# Patient Record
Sex: Female | Born: 2005 | Race: Black or African American | Hispanic: No | Marital: Single | Smoking: Never smoker
Health system: Southern US, Community
[De-identification: ages and names within clinical notes are randomized; demographics above are authoritative.]

---
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
## 2024-11-13 ENCOUNTER — Other Ambulatory Visit: Payer: Self-pay

## 2024-11-13 ENCOUNTER — Encounter: Payer: Self-pay | Admitting: Medical Oncology

## 2024-11-13 ENCOUNTER — Emergency Department
Admission: EM | Admit: 2024-11-13 | Discharge: 2024-11-13 | Disposition: A | Discharge status: Other Acute Inpatient Hospital | Payer: Self-pay | Attending: Emergency Medicine | Admitting: Emergency Medicine

## 2024-11-13 ENCOUNTER — Inpatient Hospital Stay
Admission: AD | Admit: 2024-11-13 | Discharge: 2024-11-19 | DRG: 885 | Disposition: A | Discharge status: Home / Self Care | Payer: 59 | Source: Intra-hospital | Attending: Psychiatry | Admitting: Psychiatry

## 2024-11-13 DIAGNOSIS — R45851 Suicidal ideations: Secondary | ICD-10-CM

## 2024-11-13 DIAGNOSIS — Z23 Encounter for immunization: Secondary | ICD-10-CM

## 2024-11-13 DIAGNOSIS — Z818 Family history of other mental and behavioral disorders: Secondary | ICD-10-CM | POA: Diagnosis not present

## 2024-11-13 DIAGNOSIS — F419 Anxiety disorder, unspecified: Secondary | ICD-10-CM | POA: Diagnosis present

## 2024-11-13 DIAGNOSIS — F332 Major depressive disorder, recurrent severe without psychotic features: Principal | ICD-10-CM | POA: Diagnosis present

## 2024-11-13 DIAGNOSIS — F329 Major depressive disorder, single episode, unspecified: Secondary | ICD-10-CM | POA: Insufficient documentation

## 2024-11-13 DIAGNOSIS — W458XXA Other foreign body or object entering through skin, initial encounter: Secondary | ICD-10-CM | POA: Insufficient documentation

## 2024-11-13 LAB — ACETAMINOPHEN LEVEL: Acetaminophen (Tylenol), Serum: <10 ug/mL — ABNORMAL LOW (ref 10–30)

## 2024-11-13 LAB — URINE DRUG SCREEN
Amphetamines: NEGATIVE
Barbiturates: NEGATIVE
Benzodiazepines: NEGATIVE
Cocaine: NEGATIVE
Fentanyl: NEGATIVE
Methadone Scn, Ur: NEGATIVE
Opiates: NEGATIVE
Tetrahydrocannabinol: POSITIVE — AB

## 2024-11-13 LAB — ETHANOL: Alcohol, Ethyl (B): <15 mg/dL

## 2024-11-13 LAB — COMPREHENSIVE METABOLIC PANEL WITH GFR
ALT: 7 U/L (ref 0–44)
AST: 16 U/L (ref 15–41)
Albumin: 4.9 g/dL (ref 3.5–5.0)
Alkaline Phosphatase: 45 U/L (ref 38–126)
Anion gap: 12 (ref 5–15)
BUN: 12 mg/dL (ref 6–20)
CO2: 25 mmol/L (ref 22–32)
Calcium: 9.3 mg/dL (ref 8.9–10.3)
Chloride: 103 mmol/L (ref 98–111)
Creatinine, Ser: 0.84 mg/dL (ref 0.44–1.00)
GFR, Estimated: >60 mL/min
Glucose, Bld: 108 mg/dL — ABNORMAL HIGH (ref 70–99)
Potassium: 3.9 mmol/L (ref 3.5–5.1)
Sodium: 140 mmol/L (ref 135–145)
Total Bilirubin: 0.6 mg/dL (ref 0.0–1.2)
Total Protein: 7.5 g/dL (ref 6.5–8.1)

## 2024-11-13 LAB — CBC
HCT: 36.9 % (ref 36.0–46.0)
Hemoglobin: 12.7 g/dL (ref 12.0–15.0)
MCH: 31.9 pg (ref 26.0–34.0)
MCHC: 34.4 g/dL (ref 30.0–36.0)
MCV: 92.7 fL (ref 80.0–100.0)
Platelets: 271 K/uL (ref 150–400)
RBC: 3.98 MIL/uL (ref 3.87–5.11)
RDW: 12.2 % (ref 11.5–15.5)
WBC: 6.6 K/uL (ref 4.0–10.5)
nRBC: 0 % (ref 0.0–0.2)

## 2024-11-13 LAB — SALICYLATE LEVEL: Salicylate Lvl: <7 mg/dL — ABNORMAL LOW (ref 7.0–30.0)

## 2024-11-13 LAB — POC URINE PREG, ED: Preg Test, Ur: NEGATIVE

## 2024-11-13 MED ADMIN — Trazodone HCl Tab 50 MG: 50 mg | ORAL | NDC 00904686861

## 2024-11-13 MED ADMIN — Hydroxyzine HCl Tab 25 MG: 25 mg | ORAL | NDC 60687067511

## 2024-11-13 MED ADMIN — Lidocaine HCl Local Preservative Free (PF) Inj 1%: 5 mL | NDC 63323049209

## 2024-11-13 MED FILL — Lidocaine HCl Local Preservative Free (PF) Inj 1%: 5.0000 mL | INTRAMUSCULAR | Qty: 5 | Status: AC

## 2024-11-13 MED FILL — Trazodone HCl Tab 50 MG: 50.0000 mg | ORAL | Qty: 1 | Status: AC

## 2024-11-13 MED FILL — Acetaminophen Tab 325 MG: 650.0000 mg | ORAL | Qty: 2 | Status: AC

## 2024-11-13 MED FILL — Hydroxyzine HCl Tab 25 MG: 25.0000 mg | ORAL | Qty: 1 | Status: AC

## 2024-11-13 MED FILL — Lidocaine HCl Local Preservative Free (PF) Inj 1%: 30.0000 mL | INTRAMUSCULAR | Qty: 30 | Status: AC

## 2024-11-13 NOTE — ED Notes (Signed)
 Dinner tray provided to pt

## 2024-11-13 NOTE — Consult Note (Signed)
 [facility] Psychiatric Consult Initial  Patient Name: .Denise Hall  MRN: [id number]  DOB: 13-May-2006  Consult Order details:  Orders (From admission, onward)     Start     Ordered   11/13/24 1129  CONSULT TO CALL ACT TEAM       Ordering Provider: Jossie Artist POUR, MD  Provider:  (Not yet assigned)  Question:  Reason for Consult?  Answer:  Psych consult   11/13/24 1129   11/13/24 1129  IP CONSULT TO PSYCHIATRY       Ordering Provider: Jossie Artist POUR, MD  Provider:  (Not yet assigned)  Question Answer Comment  Consult Timeframe ROUTINE - requires response within 24 hours   Reason for Consult? Consult for medication management   Contact phone number where the requesting provider can be reached [phone]      11/13/24 1129             Mode of Visit: In person    Psychiatry Consult Evaluation  Service Date: November 13, 2024 LOS:  LOS: 0 days  Chief Complaint I got into an argument with my mom  Primary Psychiatric Diagnoses   Suicidal ideation   Assessment   Denise Hall is a 18 y.o. female admitted: Presented to the EDfor 11/13/2024 10:29 AM for threatening to kill herself today with a knife . She carries the psychiatric diagnoses of MDD and has a past medical history of  none stated.   Due to patient's presentation, including threats of self-harm with a knife requiring police intervention and taser deployment, recent undisclosed pill ingestion per collateral report, pattern of frequent suicidal statements per mother, lack of engagement with outpatient mental health services, poor insight with minimization of concerning behaviors, and current lack of safety plan or support system, patient poses significant risk to self. Recommend upholding involuntary commitment. Patient is recommended for inpatient psychiatric admission.    Diagnoses:  Active Hospital problems: Active Problems:   Suicidal ideation    Plan   ## Psychiatric Medication Recommendations:  Defer to  inpatient psychiatric unit  ## Medical Decision Making Capacity: Not specifically addressed in this encounter  ## Further Work-up:   -- EKG obtained today and showed QTc of 400 -- Pertinent labwork reviewed earlier this admission includes: CBC, CMP, ethanol, acetaminophen  and salicylate level pending   ## Disposition:--Patient is recommended for inpatient psychiatric admission  ## Behavioral / Environmental: - No specific recommendations at this time.     ## Safety and Observation Level:  - Based on my clinical evaluation, I estimate the patient to be at low risk of self harm in the current setting. - At this time, we recommend  routine. This decision is based on my review of the chart including patient's history and current presentation, interview of the patient, mental status examination, and consideration of suicide risk including evaluating suicidal ideation, plan, intent, suicidal or self-harm behaviors, risk factors, and protective factors. This judgment is based on our ability to directly address suicide risk, implement suicide prevention strategies, and develop a safety plan while the patient is in the clinical setting. Please contact our team if there is a concern that risk level has changed.  CSSR Risk Category:C-SSRS RISK CATEGORY: No Risk  Suicide Risk Assessment: Patient has following modifiable risk factors for suicide: lack of access to outpatient mental health resources, current symptoms: anxiety/panic, insomnia, impulsivity, anhedonia, hopelessness, and triggering events, which we are addressing by recommending inpatient psychiatric admission. Patient has following non-modifiable or demographic risk factors for  suicide: history of suicide attempt and psychiatric hospitalization Patient has the following protective factors against suicide: Supportive family  Thank you for this consult request. Recommendations have been communicated to the primary team.  We will sign off at  this time.   [name], NP        History of Present Illness  Relevant Aspects of Hospital ED   Patient Report:  Patient presented under involuntary commitment via law enforcement. Per IVC papers, law enforcement was called to the residence where patient reportedly picked up a knife, held it to her stomach, and threatened to kill herself. Of note, police reported patient was tased due to not following commands and attempting to harm herself.  During assessment with the psychiatry team, patient reported she and her mother got into an argument that started yesterday and continued into today. Patient stated her mother called the police during this altercation. Patient reported she pulled out the knife because her mother threatened to send her back to the mental hospital. Patient acknowledged stating she was going to kill herself but reported she did not actually mean this. She stated she feels like she has to make statements such as this so that her family will listen to her.  Patient currently denies suicidal or homicidal ideation. She denied auditory or visual hallucinations. Patient reported previously being prescribed psychotropic medications but stopped taking them shortly after her most recent psychiatric admission in February 2024 due to nightmares and medication causing her to wet the bed. Patient reported past self-harm with most recent attempt being 2 years ago when she intentionally overdosed on pills. Patient denied current depression but endorsed feeling overwhelmed. She reported that school, working full-time, and her relationship with her mother are overwhelming to her. 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 She denied alcohol and tobacco use but reported occasional THC use. Patient denied access to firearms or pending legal charges.  Patient lives at home with her mother and mother's wife. Patient denied  being established with any outpatient mental health providers or having access to outpatient mental health care.  Collateral information was obtained from patient's mother with patient's permission. Mother reported she was planning to take out IVC papers today after patient told her yesterday that she took 5 pills yesterday and was going to let her know if it worked. Mother is unsure what pills patient took as patient did not disclose this information. Mother reported patient soon after texted her stating it did not work. Mother reported patient often threatens to kill herself, stating this occurs at least once a week. Mother reports patient says she is alone and a lonely person, which contributes to these statements. Mother expressed frustration that patient says she does not listen to her, yet patient does not talk or disclose feelings to her. Mother confirmed patient is not aligned with any mental health services in the community. Mother reported she feels patient needs help and noted that she and patient's brother also suffer with depression. She reported attempting to normalize asking for help by explaining that family members struggle with mental health as well and tried to explain that mental health is a disease, to which patient reportedly responded you think I'm a disease?  On mental status examination, patient was alert and oriented to person, place, time, and situation. There was no evidence of mania or psychosis, and patient did not appear to be responding to internal stimuli. Patient demonstrated poor  insight, minimizing the events that occurred and stating I cannot be here for 5 days.  Psych ROS:  Depression: Denied Anxiety: Endorsed Mania (lifetime and current): Denied Psychosis: (lifetime and current): Denied  Collateral information:  Contacted mother, Jon at number provided by patient on 11/13/2024    Psychiatric and Social History  Psychiatric History:  Information  collected from patient/chart review  Prev Dx/Sx: MDD Current Psych Provider: None Home Meds (current): None Previous Med Trials: Lexapro Therapy: Denied  Prior Psych Hospitalization: Yes Prior Self Harm: Yes Prior Violence: Patient denied  Family Psych History: Depression on maternal side Family Hx suicide: Denied  Social History:   Educational Hx: In college 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 Hx: Denied Living Situation: With mother Access to weapons/lethal means: Denied access to firearms  Substance History Alcohol: Denied Tobacco: Denied Illicit drugs: Reported occasional THC use Prescription drug abuse: Denied Rehab hx: Denied  Exam Findings  Physical Exam: Reviewed and agree with the physical exam findings conducted by the medical provider Vital Signs:  Temp:  [97.5 F (36.4 C)] 97.5 F (36.4 C) (12/25 1042) Pulse Rate:  [84] 84 (12/25 1042) Resp:  [16] 16 (12/25 1042) BP: (121)/(69) 121/69 (12/25 1042) SpO2:  [100 %] 100 % (12/25 1042) Weight:  [59 kg] 59 kg (12/25 1043) Blood pressure 121/69, pulse 84, temperature (!) 97.5 F (36.4 C), temperature source Oral, resp. rate 16, height 5' 2 (1.575 m), weight 59 kg, SpO2 100%. Body mass index is 23.78 kg/m.    Mental Status Exam: General Appearance: Casual  Orientation:  Full (Time, Place, and Person)  Memory:  Fair  Concentration:  Concentration: Fair  Recall:  Fair  Attention  Fair  Eye Contact:  Fair  Speech:  Clear and Coherent  Language:  Good  Volume:  Decreased  Mood: Depressed  Affect:  Tearful  Thought Process:  Coherent, Goal Directed, and Linear  Thought Content:  Logical  Suicidal Thoughts:  No  Homicidal Thoughts:  No  Judgement:  Impaired  Insight:  Lacking  Psychomotor Activity:  Normal  Akathisia:  No  Fund of Knowledge:  Good      Assets:  Communication Skills Housing Physical Health  Cognition:  WNL  ADL's:  Intact  AIMS (if indicated):        Other History    These have been pulled in through the EMR, reviewed, and updated if appropriate.  Family History:  The patient's family history is not on file.  Medical History: History reviewed. No pertinent past medical history.  Surgical History: History reviewed. No pertinent surgical history.   Medications:  Current Medications[1]  Allergies: Allergies[2]  [name], NP This note was created using Dragon dictation software. Please excuse any inadvertent transcription errors. Case was discussed with supervising physician Dr. Jadapalle who is agreeable with current plan.       [1]  Current Facility-Administered Medications:    lidocaine  (PF) (XYLOCAINE ) 1 % injection 5 mL, 5 mL, Other, Once, Bradler, Evan K, MD No current outpatient medications on file. [2] No Known Allergies

## 2024-11-13 NOTE — ED Notes (Signed)
 Pt reports she told her mom that she wants to kill herself because that is the only way she felt she could get her attention. Denies SI/HI/AVH on assessment. Tearful during assessment but agreed to labs.

## 2024-11-13 NOTE — ED Notes (Signed)
 Pt's belongings 1 pair crocs 1 pair of black socks I black sweat pant 1 brown bra top I brown jacket 1 pink bunnet 1 brown scrunches. 1 gold anklet I neck chain 1 nose ring 1 pair of studs all placed in a hospital urine cup.

## 2024-11-13 NOTE — ED Notes (Signed)
 ivc accepted to [facility] on 11/13/24.

## 2024-11-13 NOTE — BH Assessment (Signed)
 Comprehensive Clinical Assessment (CCA) Note  11/13/2024 Denise Hall [id number]  Chief Complaint:  Chief Complaint  Patient presents with   IVC   Visit Diagnosis: Major Depressive Disorder   Denise Hall is 18 year old female who presents to the ER via law enforcement, under IVC. Per the IVC, Officer's respondent to her residents and in front of them she picked up a kitchen knife and threatened to kill herself and put the knife to her stomach. She was tased before she could stab herself.   Per the patient, she and her mother had an argument yesterday (11/12/2024), and she said she wanted to end her life. She further reports, the only way her mother listens to her if I say that. She admits to grabbing the knife today and saying she was going to end her life. Per her report, she grabbed the knife because her mother threatened to send her to a mental hospital. She shared her current stressors are her work, school and relationship with her mother.    Per the report of the patient's mother, the patient communicated with her yesterday (11/12/2024), that she ingested five pills in attempt to end her life. When the mother asked what she took she said she didn't know and proceeded to say, if I wake up I'll call you. The mother states, the patient contacted her within thirty minutes and said she was still alive. Today (11/13/2024), the argument continued and when law enforcement arrived to the house, they were going to stay with the patient, while the mother go to the magistrate and petition for her to be under IVC. However, she grabbed the knife and that's when patent examiner filed for the petition. The mother further reports, the patient threatens to end her life, once a week. She tried to get her in outpatient treatment to address the depression but the patient having a difficulty accepting she has depression and needs help. Previous medications caused her to wet the bed and she stop taking them.  The patient's mother and siblings both are prescribed medications to address their depression. When the mother tries to explain to the patient that depression is a sickness, the patient internalizes it and believe the mother is saying she is disease.  During the interview, the patient was calm, cooperative and pleasant. She became tearful when the psych team explained why she was being recommended for inpatient treatment. She admits to the use of cannabis, and denies the use of any other mind-altering substance. She denies involvement with the legal system.  CCA Screening, Triage and Referral (STR)  Patient Reported Information How did you hear about us ? Family/Friend  What Is the Reason for Your Visit/Call Today? Brought into ER under IVC due to voicing SI.  How Long Has This Been Causing You Problems? 1 wk - 1 month  What Do You Feel Would Help You the Most Today? Treatment for Depression or other mood problem   Have You Recently Had Any Thoughts About Hurting Yourself? No  Are You Planning to Commit Suicide/Harm Yourself At This time? No   Flowsheet Row ED from 11/13/2024 in [facility] Emergency Department at [facility]  C-SSRS RISK CATEGORY No Risk    Have you Recently Had Thoughts About Hurting Someone Denise Hall? No  Are You Planning to Harm Someone at This Time? No  Explanation: No data recorded  Have You Used Any Alcohol or Drugs in the Past 24 Hours? Yes  How Long Ago Did You Use Drugs or Alcohol? No data  recorded What Did You Use and How Much? THC   Do You Currently Have a Therapist/Psychiatrist? No  Name of Therapist/Psychiatrist:    Have You Been Recently Discharged From Any Office Practice or Programs? No  Explanation of Discharge From Practice/Program: No data recorded    CCA Screening Triage Referral Assessment Type of Contact: Face-to-Face  Telemedicine Service Delivery:   Is this Initial or Reassessment?   Date Telepsych consult ordered in [facility]:     Time Telepsych consult ordered in [facility]:    Location of Assessment: [facility] ED  Provider Location: [facility] ED   Collateral Involvement: No data recorded  Does Patient Have a Court Appointed Legal Guardian? No  Legal Guardian Contact Information: No data recorded Copy of Legal Guardianship Form: No data recorded Legal Guardian Notified of Arrival: No data recorded Legal Guardian Notified of Pending Discharge: No data recorded If Minor and Not Living with Parent(s), Who has Custody? No data recorded Is CPS involved or ever been involved? Never  Is APS involved or ever been involved? Never   Patient Determined To Be At Risk for Harm To Self or Others Based on Review of Patient Reported Information or Presenting Complaint? No  Method: No data recorded Availability of Means: No data recorded Intent: No data recorded Notification Required: No data recorded Additional Information for Danger to Others Potential: No data recorded Additional Comments for Danger to Others Potential: No data recorded Are There Guns or Other Weapons in Your Home? No  Types of Guns/Weapons: No data recorded Are These Weapons Safely Secured?                            No data recorded Who Could Verify You Are Able To Have These Secured: No data recorded Do You Have any Outstanding Charges, Pending Court Dates, Parole/Probation? No data recorded Contacted To Inform of Risk of Harm To Self or Others: No data recorded   Does Patient Present under Involuntary Commitment? Yes    [place] of Residence: [place]   Patient Currently Receiving the Following Services: Not Receiving Services   Determination of Need: Emergent (2 hours)   Options For Referral: Inpatient Hospitalization; ED Visit     CCA Biopsychosocial Patient Reported Schizophrenia/Schizoaffective Diagnosis in Past: No   Strengths: In school, working and stable housing.   Mental Health Symptoms Depression:  Change in energy/activity    Duration of Depressive symptoms: Duration of Depressive Symptoms: Greater than two weeks   Mania:  None   Anxiety:   N/A   Psychosis:  None   Duration of Psychotic symptoms:    Trauma:  N/A   Obsessions:  N/A   Compulsions:  N/A   Inattention:  N/A   Hyperactivity/Impulsivity:  N/A   Oppositional/Defiant Behaviors:  N/A   Emotional Irregularity:  N/A   Other Mood/Personality Symptoms:  No data recorded   Mental Status Exam Appearance and self-care  Stature:  Average   Weight:  Average weight   Clothing:  Neat/clean   Grooming:  Normal   Cosmetic use:  None   Posture/gait:  Normal   Motor activity:  -- (Within normal range)   Sensorium  Attention:  Normal   Concentration:  Normal   Orientation:  X5   Recall/memory:  Normal   Affect and Mood  Affect:  Depressed; Appropriate   Mood:  Depressed   Relating  Eye contact:  Normal   Facial expression:  Depressed; Responsive   Attitude  toward examiner:  Cooperative   Thought and Language  Speech flow: Clear and Coherent   Thought content:  Appropriate to Mood and Circumstances   Preoccupation:  None   Hallucinations:  None   Organization:  Intact; Linear   Company Secretary of Knowledge:  Fair   Intelligence:  Average   Abstraction:  Normal   Judgement:  Normal   Brewing Technologist   Insight:  Poor   Decision Making:  Impulsive   Social Functioning  Social Maturity:  Isolates   Social Judgement:  Chief Of Staff   Stress  Stressors:  Family conflict; Transitions; Work   Coping Ability:  Overwhelmed; Exhausted   Skill Deficits:  None   Supports:  Friends/Service system     Religion: Religion/Spirituality Are You A Religious Person?: No  Leisure/Recreation: Leisure / Recreation Do You Have Hobbies?: No  Exercise/Diet: Exercise/Diet Do You Exercise?: No Have You Gained or Lost A Significant Amount of Weight in the Past Six Months?: No Do You  Follow a Special Diet?: No Do You Have Any Trouble Sleeping?: No   CCA Employment/Education Employment/Work Situation: Employment / Work Situation Employment Situation: Surveyor, Minerals Job has Been Impacted by Current Illness: No Has Patient ever Been in the [employer]?: No  Education: Education Is Patient Currently Attending School?: No Did Theme Park Manager?: Yes   CCA Family/Childhood History Family and Relationship History: Family history Marital status: Single Does patient have children?: No  Childhood History:  Childhood History By whom was/is the patient raised?: Mother Did patient suffer any verbal/emotional/physical/sexual abuse as a child?: No Did patient suffer from severe childhood neglect?: No Has patient ever been sexually abused/assaulted/raped as an adolescent or adult?: No Was the patient ever a victim of a crime or a disaster?: No Witnessed domestic violence?: No Has patient been affected by domestic violence as an adult?: No   CCA Substance Use Alcohol/Drug Use: Alcohol / Drug Use Pain Medications: See MAR Prescriptions: See MAR Over the Counter: See MAR History of alcohol / drug use?: Yes Substance #1 Name of Substance 1: Cannabis 1 - Frequency: Unable to quantify 1 - Last Use / Amount: Unable to quantify    ASAM's:  Six Dimensions of Multidimensional Assessment  Dimension 1:  Acute Intoxication and/or Withdrawal Potential:      Dimension 2:  Biomedical Conditions and Complications:      Dimension 3:  Emotional, Behavioral, or Cognitive Conditions and Complications:     Dimension 4:  Readiness to Change:     Dimension 5:  Relapse, Continued use, or Continued Problem Potential:     Dimension 6:  Recovery/Living Environment:     ASAM Severity Score:    ASAM Recommended Level of Treatment:     Substance use Disorder (SUD)    Recommendations for Services/Supports/Treatments:    Disposition Recommendation per psychiatric provider:  Inpatient Treatment   DSM5 Diagnoses: Patient Active Problem List   Diagnosis Date Noted   Suicidal ideation 11/13/2024    Referrals to Alternative Service(s): Referred to Alternative Service(s):   Place:   Date:   Time:    Referred to Alternative Service(s):   Place:   Date:   Time:    Referred to Alternative Service(s):   Place:   Date:   Time:    Referred to Alternative Service(s):   Place:   Date:   Time:     Kiki DOROTHA Barge MS, LCAS, [facility], [facility] Therapeutic Triage Specialist 11/13/2024 2:25 PM
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
## 2024-11-13 NOTE — ED Notes (Signed)
 Provider in with pt to remove the taser prong that had been lodged in her breast tissue. Slight bleeding and swelling noted, area cleaned and Band-Aid applied. Pt tolerated it well

## 2024-11-13 NOTE — ED Notes (Signed)
 Lunch tray provided to pt.

## 2024-11-13 NOTE — ED Notes (Signed)
 Patient is IVC pending consult

## 2024-11-13 NOTE — ED Notes (Signed)
 Patient is IVC pending admit to [facility]
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
## 2024-11-14 DIAGNOSIS — R45851 Suicidal ideations: Secondary | ICD-10-CM | POA: Diagnosis not present

## 2024-11-14 MED ADMIN — Trazodone HCl Tab 50 MG: 50 mg | ORAL | NDC 00904686861

## 2024-11-14 MED ADMIN — Oxcarbazepine Tab 150 MG: 150 mg | ORAL | NDC 00904726261

## 2024-11-14 MED FILL — Oxcarbazepine Tab 150 MG: 150.0000 mg | ORAL | Qty: 1 | Status: AC

## 2024-11-14 MED FILL — Pantoprazole Sodium EC Tab 40 MG (Base Equiv): 40.0000 mg | ORAL | Qty: 1 | Status: AC

## 2024-11-14 MED FILL — Influenza Virus Vaccine Split PF Susp Pref Syringe 0.5 ML: 0.5000 mL | INTRAMUSCULAR | Qty: 0.5 | Status: AC

## 2024-11-14 NOTE — Progress Notes (Signed)
" °   11/13/24 2300  Psych Admission Type (Psych Patients Only)  Admission Status Involuntary  Psychosocial Assessment  Patient Complaints Anxiety;Depression  Eye Contact Brief  Facial Expression Flat  Affect Anxious;Appropriate to circumstance  Speech Logical/coherent  Interaction Assertive  Motor Activity Other (Comment) (wdl)  Appearance/Hygiene Unremarkable  Behavior Characteristics Cooperative;Appropriate to situation  Mood Pleasant  Thought Process  Coherency WDL  Content WDL  Delusions None reported or observed  Perception WDL  Hallucination None reported or observed  Judgment WDL  Confusion WDL  Danger to Self  Current suicidal ideation? Denies  Danger to Others  Danger to Others None reported or observed    "
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
## 2024-11-14 NOTE — Progress Notes (Signed)
" °   11/14/24 1802  Spiritual Encounters  Type of Visit Initial  Care provided to: Patient  Referral source Patient request  Reason for visit Routine spiritual support  OnCall Visit Yes  Interventions  Spiritual Care Interventions Made Established relationship of care and support;Compassionate presence;Reflective listening;Reconciliation with self/others;Encouragement;Meaning making   Chaplain met with patient per patient request.  Provide compassionate presence, reflective and supportive listening.  Helped her process the difficult communications with her mother, and discussed the hopes of reconciling the differences and conflicts they are having.  Hopeful that visit with mother will go well.  Offered continued support and conversation.   "

## 2024-11-14 NOTE — Group Note (Signed)
 Date:  11/14/2024 Time:  11:59 AM  Group Topic/Focus:  Emotional Education:   The focus of this group is to discuss what feelings/emotions are, and how they are experienced.    Participation Level:  Active  Participation Quality:  Appropriate  Affect:  Appropriate  Cognitive:  Appropriate  Insight: Appropriate  Engagement in Group:  Engaged  Modes of Intervention:  Activity and Discussion  Additional Comments:    Denise Hall June 11/14/2024, 11:59 AM

## 2024-11-14 NOTE — Progress Notes (Signed)
 Pt calm and pleasant during assessment denying SI/HI/AVH. Pt observed interacting appropriately with staff and peers on the unit. Pt compliant with medication administration per MD orders. Pt given education, support, and encouragement to be active in her treatment plan. Pt being monitored Q 15 minutes for safety per unit protocol, remains safe on the unit

## 2024-11-14 NOTE — Group Note (Signed)
 Date:  11/14/2024 Time:  8:52 PM  Group Topic/Focus:  Overcoming Stress:   The focus of this group is to define stress and help patients assess their triggers.    Participation Level:  Active  Participation Quality:  Appropriate  Affect:  Appropriate  Cognitive:  Alert  Insight: Appropriate  Engagement in Group:  Engaged  Modes of Intervention:  Discussion and Education  Additional Comments:    Ginny JONETTA Galeazzi 11/14/2024, 8:52 PM

## 2024-11-14 NOTE — BH IP Treatment Plan (Signed)
 Interdisciplinary Treatment and Diagnostic Plan Update  11/14/2024 Time of Session: 10:10AM Denise Hall MRN: [id number]  Principal Diagnosis: Suicidal ideations  Secondary Diagnoses: Principal Problem:   Suicidal ideations   Current Medications:  Current Facility-Administered Medications  Medication Dose Route Frequency Provider Last Rate Last Admin   acetaminophen  (TYLENOL ) tablet 650 mg  650 mg Oral Q6H PRN Smith, Annie B, NP       alum & mag hydroxide-simeth (MAALOX/MYLANTA) 200-200-20 MG/5ML suspension 30 mL  30 mL Oral Q4H PRN Smith, Annie B, NP       haloperidol  (HALDOL ) tablet 5 mg  5 mg Oral TID PRN Smith, Annie B, NP       And   diphenhydrAMINE  (BENADRYL ) capsule 50 mg  50 mg Oral TID PRN Smith, Annie B, NP       haloperidol  lactate (HALDOL ) injection 5 mg  5 mg Intramuscular TID PRN Smith, Annie B, NP       And   diphenhydrAMINE  (BENADRYL ) injection 50 mg  50 mg Intramuscular TID PRN Smith, Annie B, NP       And   LORazepam  (ATIVAN ) injection 2 mg  2 mg Intramuscular TID PRN Smith, Annie B, NP       haloperidol  lactate (HALDOL ) injection 10 mg  10 mg Intramuscular TID PRN Smith, Annie B, NP       And   diphenhydrAMINE  (BENADRYL ) injection 50 mg  50 mg Intramuscular TID PRN Smith, Annie B, NP       And   LORazepam  (ATIVAN ) injection 2 mg  2 mg Intramuscular TID PRN Smith, Annie B, NP       hydrOXYzine  (ATARAX ) tablet 25 mg  25 mg Oral TID PRN Smith, Annie B, NP   25 mg at 11/13/24 2234   magnesium  hydroxide (MILK OF MAGNESIA) suspension 30 mL  30 mL Oral Daily PRN Smith, Annie B, NP       traZODone  (DESYREL ) tablet 50 mg  50 mg Oral QHS PRN Smith, Annie B, NP   50 mg at 11/13/24 2234   PTA Medications: No medications prior to admission.    Patient Stressors:    Patient Strengths:    Treatment Modalities: Medication Management, Group therapy, Case management,  1 to 1 session with clinician, Psychoeducation, Recreational therapy.   Physician Treatment Plan for  Primary Diagnosis: Suicidal ideations Long Term Goal(s):     Short Term Goals:    Medication Management: Evaluate patient's response, side effects, and tolerance of medication regimen.  Therapeutic Interventions: 1 to 1 sessions, Unit Group sessions and Medication administration.  Evaluation of Outcomes: Not Met  Physician Treatment Plan for Secondary Diagnosis: Principal Problem:   Suicidal ideations  Long Term Goal(s):     Short Term Goals:       Medication Management: Evaluate patient's response, side effects, and tolerance of medication regimen.  Therapeutic Interventions: 1 to 1 sessions, Unit Group sessions and Medication administration.  Evaluation of Outcomes: Not Met   RN Treatment Plan for Primary Diagnosis: Suicidal ideations Long Term Goal(s): Knowledge of disease and therapeutic regimen to maintain health will improve  Short Term Goals: Ability to demonstrate self-control, Ability to participate in decision making will improve, Ability to verbalize feelings will improve, Ability to disclose and discuss suicidal ideas, Ability to identify and develop effective coping behaviors will improve, and Compliance with prescribed medications will improve  Medication Management: RN will administer medications as ordered by provider, will assess and evaluate patient's response and provide education to patient  for prescribed medication. RN will report any adverse and/or side effects to prescribing provider.  Therapeutic Interventions: 1 on 1 counseling sessions, Psychoeducation, Medication administration, Evaluate responses to treatment, Monitor vital signs and CBGs as ordered, Perform/monitor CIWA, COWS, AIMS and Fall Risk screenings as ordered, Perform wound care treatments as ordered.  Evaluation of Outcomes: Not Met   LCSW Treatment Plan for Primary Diagnosis: Suicidal ideations Long Term Goal(s): Safe transition to appropriate next level of care at discharge, Engage patient  in therapeutic group addressing interpersonal concerns.  Short Term Goals: Engage patient in aftercare planning with referrals and resources, Increase social support, Increase ability to appropriately verbalize feelings, Increase emotional regulation, Facilitate acceptance of mental health diagnosis and concerns, and Increase skills for wellness and recovery  Therapeutic Interventions: Assess for all discharge needs, 1 to 1 time with Social worker, Explore available resources and support systems, Assess for adequacy in community support network, Educate family and significant other(s) on suicide prevention, Complete Psychosocial Assessment, Interpersonal group therapy.  Evaluation of Outcomes: Not Met   Progress in Treatment: Attending groups: Yes. Participating in groups: Yes. Taking medication as prescribed: Yes. Toleration medication: Yes. Family/Significant other contact made: No, will contact:  once permission is granted Patient understands diagnosis: Yes. Discussing patient identified problems/goals with staff: Yes. Medical problems stabilized or resolved: Yes. Denies suicidal/homicidal ideation: Yes. Issues/concerns per patient self-inventory: No. Other: none  New problem(s) identified: No, Describe:  none  New Short Term/Long Term Goal(s): detox, elimination of symptoms of psychosis, medication management for mood stabilization; elimination of SI thoughts; development of comprehensive mental wellness/sobriety plan.   Patient Goals:  work on my anxiety and cope with things in life  Discharge Plan or Barriers: CSW to assist patient in development of appropriate discharge plans.   Reason for Continuation of Hospitalization: Anxiety Depression Medication stabilization Suicidal ideation  Estimated Length of Stay:  1-7 days  Last 3 [place] Suicide Severity Risk Score: Flowsheet Row Admission (Current) from 11/13/2024 in [facility] Most recent  reading at 11/13/2024 11:00 PM ED from 11/13/2024 in [facility] Emergency Department at [facility] Most recent reading at 11/13/2024 12:14 PM  C-SSRS RISK CATEGORY No Risk No Risk    Last PHQ 2/9 Scores:     No data to display          Scribe for Treatment Team: Sherryle JINNY Margo, LCSW 11/14/2024 10:35 AM

## 2024-11-14 NOTE — Progress Notes (Signed)
" °   11/14/24 1500  Psych Admission Type (Psych Patients Only)  Admission Status Involuntary  Psychosocial Assessment  Patient Complaints Anxiety;Depression  Eye Contact Brief  Facial Expression Flat  Affect Anxious  Speech Logical/coherent  Interaction Assertive  Motor Activity Slow  Appearance/Hygiene Unremarkable  Behavior Characteristics Cooperative  Mood Pleasant  Thought Process  Coherency WDL  Content WDL  Delusions None reported or observed  Perception WDL  Hallucination None reported or observed  Judgment WDL  Confusion WDL  Danger to Self  Current suicidal ideation? Denies  Danger to Others  Danger to Others None reported or observed    "
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
## 2024-11-14 NOTE — Plan of Care (Signed)
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
## 2024-11-14 NOTE — Group Note (Deleted)
 Date:  11/14/2024 Time:  8:47 PM  Group Topic/Focus:  Overcoming Stress:   The focus of this group is to define stress and help patients assess their triggers.     Participation Level:  {BHH PARTICIPATION OZCZO:77735}  Participation Quality:  {BHH PARTICIPATION QUALITY:22265}  Affect:  {BHH AFFECT:22266}  Cognitive:  {BHH COGNITIVE:22267}  Insight: {BHH Insight2:20797}  Engagement in Group:  {BHH ENGAGEMENT IN HMNLE:77731}  Modes of Intervention:  {BHH MODES OF INTERVENTION:22269}  Additional Comments:  PIERRETTE Ginny JONETTA Orma 11/14/2024, 8:47 PM

## 2024-11-14 NOTE — Plan of Care (Signed)
  Problem: Education: Goal: Emotional status will improve Outcome: Progressing   Problem: Education: Goal: Mental status will improve Outcome: Progressing   Problem: Education: Goal: Verbalization of understanding the information provided will improve Outcome: Progressing   Problem: Activity: Goal: Interest or engagement in activities will improve Outcome: Progressing   Problem: Coping: Goal: Ability to verbalize frustrations and anger appropriately will improve Outcome: Progressing

## 2024-11-14 NOTE — Group Note (Signed)
 Date:  11/14/2024 Time:  5:52 PM  Group Topic/Focus:  Activity Group: The focus of the group is to promote activity for the patients and to encourage them to go outside to the courtyard and get some fresh air and some exercise.    Participation Level:  Did Not Attend   Denise Hall 11/14/2024, 5:52 PM

## 2024-11-15 DIAGNOSIS — R45851 Suicidal ideations: Secondary | ICD-10-CM | POA: Diagnosis not present

## 2024-11-15 MED ADMIN — Trazodone HCl Tab 50 MG: 50 mg | ORAL | NDC 00904686861

## 2024-11-15 MED ADMIN — Oxcarbazepine Tab 150 MG: 150 mg | ORAL | NDC 00904726261

## 2024-11-15 MED ADMIN — Pantoprazole Sodium EC Tab 40 MG (Base Equiv): 40 mg | ORAL | NDC 51079005101

## 2024-11-15 NOTE — Group Note (Signed)
 LCSW Group Therapy Note   Group Date: 11/15/2024 Start Time: 1620 End Time: 1650   Type of Therapy and Topic:  Group Therapy: Managing Intrusive thoughts  Participation Level:  Active  Description of Group: The purpose of this group is to assist patients in learning how to regulate negative intrusive thoughts.  Intrusive thoughts are unwanted thoughts or vivid images that suddenly enter your mind.  They may be disturbing to the person experiencing them and may trigger feelings like anxiety, shame, or disgust.  Emphasis will be placed on coping with negative intrusive thoughts in situations and using positive strategies to combat negative intrusive thoughts and using positive strategies combat negative thoughts.   Therapeutic Goals: 1. Patient will identify the difference of an inconsequential thought vs an urgent thought. 2.Patient will discuss when they need to seek assistance with negative intrusive thoughts.           Summary of Patient Progress:   The facilitator and patient discussed negative intrusive thoughts and their impact on daily life.  Group members chose a negative intrusive thought and strategies for managing it .  The facilitator and patient examined how different experiences can influence their thoughts.  The patient reflected on how their thoughts, negative or positive can affect outcomes.  The patient was receptive to feedback from both peers and the facilitator and contributed to creating a supportive environment, encouraging others to open up and share.  Therapeutic Modalities:  Cognitive Behavioral Therapy Dialectical Behavior Therapy  [place] Orbin Mayeux, LCSWA 11/15/2024  5:13 PM

## 2024-11-15 NOTE — Progress Notes (Signed)
" °   11/15/24 0846  Psych Admission Type (Psych Patients Only)  Admission Status Involuntary  Psychosocial Assessment  Patient Complaints Anxiety;Depression  Eye Contact Brief  Facial Expression Flat  Affect Anxious  Speech Logical/coherent  Interaction Assertive  Motor Activity Slow  Appearance/Hygiene Unremarkable  Behavior Characteristics Cooperative;Appropriate to situation  Mood Pleasant  Aggressive Behavior  Effect No apparent injury  Thought Process  Coherency WDL  Content WDL  Delusions None reported or observed  Perception WDL  Hallucination None reported or observed  Judgment WDL  Confusion WDL  Danger to Self  Current suicidal ideation? Denies  Danger to Others  Danger to Others None reported or observed    "

## 2024-11-15 NOTE — Group Note (Signed)
 Date:  11/15/2024 Time:  6:04 PM  Group Topic/Focus:  Dimensions of Wellness:   The focus of this group is to introduce the topic of wellness and discuss the role each dimension of wellness plays in total health.    Participation Level:  Active  Participation Quality:  Appropriate  Affect:  Appropriate  Cognitive:  Appropriate  Insight: Appropriate  Engagement in Group:  Engaged  Modes of Intervention:  Activity  Additional Comments:    Camellia HERO Mickey Esguerra 11/15/2024, 6:04 PM

## 2024-11-15 NOTE — Group Note (Signed)
 Date:  11/15/2024 Time:  1:33 PM  Group Topic/Focus:  Building Self Esteem:   The Focus of this group is helping patients become aware of the effects of self-esteem on their lives, the things they and others do that enhance or undermine their self-esteem, seeing the relationship between their level of self-esteem and the choices they make and learning ways to enhance self-esteem.    Participation Level:  Active  Participation Quality:  Appropriate  Affect:  Appropriate  Cognitive:  Appropriate  Insight: Appropriate  Engagement in Group:  Engaged  Modes of Intervention:  Activity and Discussion  Additional Comments:    Denise Hall June 11/15/2024, 1:33 PM

## 2024-11-15 NOTE — Plan of Care (Signed)

## 2024-11-15 NOTE — Progress Notes (Signed)
 [facility] MD Progress Note  11/15/2024 10:33 AM Denise Hall  MRN:  [id number]   Subjective:  Chart reviewed, case discussed in multidisciplinary meeting, patient seen during rounds.   12/27: On interview today, patient is found resting in bed.  She is alert and oriented, calm and cooperative.  She reports tolerating current medication regimen, including initiation of Trileptal , well without adverse effects.  She reports stable sleep and appetite.  She denies SI/HI/plan and denies hallucinations.  She reports a recent episode of feeling overwhelmed, but was able to calm herself down by taking some time to herself in her room.  She is unable to identify what triggered these feelings.  She reports anxiety is improving overall.  She denies current depressive symptoms.  Past Psychiatric History: see h&P Family History: History reviewed. No pertinent family history. Social History:  Social History   Substance and Sexual Activity  Alcohol Use Never     Social History   Substance and Sexual Activity  Drug Use Not on file    Social History   Socioeconomic History   Marital status: Single    Spouse name: Not on file   Number of children: Not on file   Years of education: Not on file   Highest education level: Not on file  Occupational History   Not on file  Tobacco Use   Smoking status: Never   Smokeless tobacco: Never  Substance and Sexual Activity   Alcohol use: Never   Drug use: Not on file   Sexual activity: Not on file  Other Topics Concern   Not on file  Social History Narrative   Not on file   Social Drivers of Health   Tobacco Use: Low Risk (11/14/2024)   Patient History    Smoking Tobacco Use: Never    Smokeless Tobacco Use: Never    Passive Exposure: Not on file  Financial Resource Strain: Not on file  Food Insecurity: No Food Insecurity (11/14/2024)   Epic    Worried About Programme Researcher, Broadcasting/film/video in the Last Year: Never true    Ran Out of Food in the Last Year: Never  true  Transportation Needs: No Transportation Needs (11/14/2024)   Epic    Lack of Transportation (Medical): No    Lack of Transportation (Non-Medical): No  Physical Activity: Not on file  Stress: Not on file  Social Connections: Not on file  Depression (EYV7-0): Not on file  Alcohol Screen: Low Risk (11/14/2024)   Alcohol Screen    Last Alcohol Screening Score (AUDIT): 0  Housing: Low Risk (11/14/2024)   Epic    Unable to Pay for Housing in the Last Year: No    Number of Times Moved in the Last Year: 0    Homeless in the Last Year: No  Utilities: Not At Risk (11/14/2024)   Epic    Threatened with loss of utilities: No  Health Literacy: Not on file   Past Medical History: History reviewed. No pertinent past medical history. History reviewed. No pertinent surgical history.  Current Medications: Current Facility-Administered Medications  Medication Dose Route Frequency Provider Last Rate Last Admin   acetaminophen  (TYLENOL ) tablet 650 mg  650 mg Oral Q6H PRN Smith, Annie B, NP       alum & mag hydroxide-simeth (MAALOX/MYLANTA) 200-200-20 MG/5ML suspension 30 mL  30 mL Oral Q4H PRN Smith, Annie B, NP       haloperidol  (HALDOL ) tablet 5 mg  5 mg Oral TID PRN Smith, Annie B, NP  And   diphenhydrAMINE  (BENADRYL ) capsule 50 mg  50 mg Oral TID PRN Smith, Annie B, NP       haloperidol  lactate (HALDOL ) injection 5 mg  5 mg Intramuscular TID PRN Smith, Annie B, NP       And   diphenhydrAMINE  (BENADRYL ) injection 50 mg  50 mg Intramuscular TID PRN Smith, Annie B, NP       And   LORazepam  (ATIVAN ) injection 2 mg  2 mg Intramuscular TID PRN Smith, Annie B, NP       haloperidol  lactate (HALDOL ) injection 10 mg  10 mg Intramuscular TID PRN Smith, Annie B, NP       And   diphenhydrAMINE  (BENADRYL ) injection 50 mg  50 mg Intramuscular TID PRN Claudene Sham B, NP       And   LORazepam  (ATIVAN ) injection 2 mg  2 mg Intramuscular TID PRN Smith, Annie B, NP       hydrOXYzine  (ATARAX ) tablet  25 mg  25 mg Oral TID PRN Smith, Annie B, NP   25 mg at 11/13/24 2234   influenza vac split trivalent PF (FLUZONE ) injection 0.5 mL  0.5 mL Intramuscular Tomorrow-1000 Donnelly Mellow, MD       magnesium  hydroxide (MILK OF MAGNESIA) suspension 30 mL  30 mL Oral Daily PRN Smith, Annie B, NP       OXcarbazepine  (TRILEPTAL ) tablet 150 mg  150 mg Oral BID Giavonni Fonder L, PA-C   150 mg at 11/15/24 9185   pantoprazole  (PROTONIX ) EC tablet 40 mg  40 mg Oral Daily Alric Geise L, PA-C   40 mg at 11/15/24 0804   traZODone  (DESYREL ) tablet 50 mg  50 mg Oral QHS PRN Smith, Annie B, NP   50 mg at 11/14/24 2106    Lab Results:  Results for orders placed or performed during the hospital encounter of 11/13/24 (from the past 48 hours)  CBC     Status: None   Collection Time: 11/13/24 11:07 AM  Result Value Ref Range   WBC 6.6 4.0 - 10.5 K/uL   RBC 3.98 3.87 - 5.11 MIL/uL   Hemoglobin 12.7 12.0 - 15.0 g/dL   HCT 63.0 63.9 - 53.9 %   MCV 92.7 80.0 - 100.0 fL   MCH 31.9 26.0 - 34.0 pg   MCHC 34.4 30.0 - 36.0 g/dL   RDW 87.7 88.4 - 84.4 %   Platelets 271 150 - 400 K/uL   nRBC 0.0 0.0 - 0.2 %    Comment: Performed at [facility], [address] [place], [place] [address]  Comprehensive metabolic panel with GFR     Status: Abnormal   Collection Time: 11/13/24 11:07 AM  Result Value Ref Range   Sodium 140 135 - 145 mmol/L   Potassium 3.9 3.5 - 5.1 mmol/L   Chloride 103 98 - 111 mmol/L   CO2 25 22 - 32 mmol/L   Glucose, Bld 108 (H) 70 - 99 mg/dL    Comment: Glucose reference range applies only to samples taken after fasting for at least 8 hours.   BUN 12 6 - 20 mg/dL   Creatinine, Ser 9.15 0.44 - 1.00 mg/dL   Calcium 9.3 8.9 - 89.6 mg/dL   Total Protein 7.5 6.5 - 8.1 g/dL   Albumin 4.9 3.5 - 5.0 g/dL   AST 16 15 - 41 U/L   ALT 7 0 - 44 U/L   Alkaline Phosphatase 45 38 - 126 U/L   Total Bilirubin  0.6 0.0 - 1.2 mg/dL   GFR, Estimated >39 >39 mL/min    Comment: (NOTE) Calculated  using the CKD-EPI Creatinine Equation (2021)    Anion gap 12 5 - 15    Comment: Performed at [facility], [address] [place], [place] [address]  Ethanol     Status: None   Collection Time: 11/13/24  4:19 PM  Result Value Ref Range   Alcohol, Ethyl (B) <15 <15 mg/dL    Comment: (NOTE) For medical purposes only. Performed at [facility], [address] [place], [place] [address]   Acetaminophen  level     Status: Abnormal   Collection Time: 11/13/24  4:19 PM  Result Value Ref Range   Acetaminophen  (Tylenol ), Serum <10 (L) 10 - 30 ug/mL    Comment: (NOTE) Toxic concentrations can be more effectively related to post dose interval; >200, >100, and >50 ug/mL serum concentrations correspond to toxic concentrations at 4, 8, and 12 hours post dose, respectively.  Performed at [facility], [address] [place], [place] [address]   Salicylate level     Status: Abnormal   Collection Time: 11/13/24  4:19 PM  Result Value Ref Range   Salicylate Lvl <7.0 (L) 7.0 - 30.0 mg/dL    Comment: Performed at [facility], [address] [place], [place] [address]  POC urine preg, ED     Status: None   Collection Time: 11/13/24  5:14 PM  Result Value Ref Range   Preg Test, Ur NEGATIVE NEGATIVE    Comment:        THE SENSITIVITY OF THIS METHODOLOGY IS >20 mIU/mL.   Urine Drug Screen     Status: Abnormal   Collection Time: 11/13/24  8:39 PM  Result Value Ref Range   Opiates NEGATIVE NEGATIVE   Cocaine NEGATIVE NEGATIVE   Benzodiazepines NEGATIVE NEGATIVE   Amphetamines NEGATIVE NEGATIVE   Tetrahydrocannabinol POSITIVE (A) NEGATIVE   Barbiturates NEGATIVE NEGATIVE   Methadone Scn, Ur NEGATIVE NEGATIVE   Fentanyl NEGATIVE NEGATIVE    Comment: (NOTE) Drug screen is for Medical Purposes only. Positive results are preliminary only. If confirmation is needed, notify lab within 5 days.  Drug Class                 Cutoff (ng/mL) Amphetamine and  metabolites 1000 Barbiturate and metabolites 200 Benzodiazepine              200 Opiates and metabolites     300 Cocaine and metabolites     300 THC                         50 Fentanyl                    5 Methadone                   300  Trazodone  is metabolized in vivo to several metabolites,  including pharmacologically active m-CPP, which is excreted in the  urine.  Immunoassay screens for amphetamines and MDMA have potential  cross-reactivity with these compounds and may provide false positive  result.  Performed at [facility], [address] [place], [place] [address]     Blood Alcohol level:  Lab Results  Component Value Date   [facility] <15 11/13/2024    Metabolic Disorder Labs: No results found for: HGBA1C, MPG No results found for: PROLACTIN No results found for: CHOL, TRIG,  HDL, CHOLHDL, VLDL, LDLCALC  Physical Findings: AIMS:  , ,  ,  ,    CIWA:    COWS:      Psychiatric Specialty Exam:  Presentation  General Appearance:  Appropriate for Environment  Eye Contact: Fair  Speech: Clear and Coherent  Speech Volume: Decreased    Mood and Affect  Mood: Anxious  Affect: Depressed   Thought Process  Thought Processes: Coherent; Linear  Orientation:Full (Time, Place and Person)  Thought Content:Logical  Hallucinations:Hallucinations: None  Ideas of Reference:None  Suicidal Thoughts:Suicidal Thoughts: No  Homicidal Thoughts:Homicidal Thoughts: No   Sensorium  Memory: Immediate Good; Recent Good  Judgment: Poor  Insight: Fair   Art Therapist  Concentration: Good  Attention Span: Good  Recall: Good  Fund of Knowledge: Fair  Language: Fair   Psychomotor Activity  Psychomotor Activity: Psychomotor Activity: Normal  Musculoskeletal: Strength & Muscle Tone: within normal limits Gait & Station: normal Assets  Assets: Manufacturing Systems Engineer; Desire for Improvement; Housing;  Social Support    Physical Exam: Physical Exam ROS Blood pressure 116/67, pulse 87, temperature 98.6 F (37 C), temperature source Oral, resp. rate 15, height 5' 2 (1.575 m), weight 59.4 kg, SpO2 99%. Body mass index is 23.96 kg/m.  Diagnosis: Principal Problem:   Suicidal ideations   PLAN: Safety and Monitoring:  -- Voluntary admission to inpatient psychiatric unit for safety, stabilization and treatment  -- Daily contact with patient to assess and evaluate symptoms and progress in treatment  -- Patient's case to be discussed in multi-disciplinary team meeting  -- Observation Level : q15 minute checks  -- Vital signs:  q12 hours  -- Precautions: suicide, elopement, and assault -- Encouraged patient to participate in unit milieu and in scheduled group therapies  2. Psychiatric Treatment:  Scheduled Medications: Trileptal  150 mg twice daily    -- The risks/benefits/side-effects/alternatives to this medication were discussed in detail with the patient and time was given for questions. The patient consents to medication trial.  3. Medical Issues Being Addressed:  No acute concerns.    4. Discharge Planning:   -- Social work and case management to assist with discharge planning and identification of hospital follow-up needs prior to discharge  -- Estimated LOS: 5-7 days Case discussed with attending physician, Dr. Hellen, who is in agreement with current plan. Camelia LITTIE Lukes, PA-C 11/15/2024, 10:33 AM

## 2024-11-15 NOTE — [facility] Suicide Risk Assessment (Signed)
 [facility] INPATIENT:  Family/Significant Other Suicide Prevention Education  Suicide Prevention Education:  Education Completed; [name], mother, [phone],  has been identified by the patient as the family member/significant other with whom the patient will be residing, and identified as the person(s) who will aid the patient in the event of a mental health crisis (suicidal ideations/suicide attempt).  With written consent from the patient, the family member/significant other has been provided the following suicide prevention education, prior to the and/or following the discharge of the patient.  The suicide prevention education provided includes the following: Suicide risk factors Suicide prevention and interventions National Suicide Hotline telephone number [facility] assessment telephone number [facility] Emergency Assistance 911 [facility] and/or Residential Mobile Crisis Unit telephone number  Request made of family/significant other to: Remove weapons (e.g., guns, rifles, knives), all items previously/currently identified as safety concern.   Remove drugs/medications (over-the-counter, prescriptions, illicit drugs), all items previously/currently identified as a safety concern.  The family member/significant other verbalizes understanding of the suicide prevention education information provided.  The family member/significant other agrees to remove the items of safety concern listed above.  Roselyn GORMAN Lento 11/15/2024, 1:10 PM

## 2024-11-15 NOTE — Plan of Care (Signed)
   Problem: Education: Goal: Emotional status will improve Outcome: Progressing Goal: Mental status will improve Outcome: Progressing

## 2024-11-16 DIAGNOSIS — R45851 Suicidal ideations: Secondary | ICD-10-CM | POA: Diagnosis not present

## 2024-11-16 MED ADMIN — Trazodone HCl Tab 50 MG: 50 mg | ORAL | NDC 00904686861

## 2024-11-16 MED ADMIN — Oxcarbazepine Tab 150 MG: 150 mg | ORAL | NDC 00904726261

## 2024-11-16 MED ADMIN — Pantoprazole Sodium EC Tab 40 MG (Base Equiv): 40 mg | ORAL | NDC 51079005101

## 2024-11-16 MED ADMIN — Acetaminophen Tab 325 MG: 650 mg | ORAL | NDC 00904677361

## 2024-11-16 MED ADMIN — Influenza Virus Vaccine Split PF Susp Pref Syringe 0.5 ML: 0.5 mL | INTRAMUSCULAR | NDC 49281042588

## 2024-11-16 NOTE — Progress Notes (Signed)
" °   11/15/24 2000  Psych Admission Type (Psych Patients Only)  Admission Status Involuntary  Psychosocial Assessment  Patient Complaints Depression  Eye Contact Brief  Facial Expression Flat  Affect Flat  Speech Logical/coherent;Soft  Interaction Assertive;Poor;No initiation  Motor Activity Slow  Appearance/Hygiene Improved  Behavior Characteristics Cooperative;Appropriate to situation  Mood Pleasant  Aggressive Behavior  Effect No apparent injury  Thought Process  Coherency WDL  Content WDL  Delusions None reported or observed  Perception WDL  Hallucination None reported or observed  Judgment WDL  Confusion WDL  Danger to Self  Current suicidal ideation? Denies  Danger to Others  Danger to Others None reported or observed   Patient alert and oriented x 3, he denies SI/HI/AVH interacting appropriately with peers and staff, affect is congruent with mood, thoughts are linear, speech is soft non tangential,  no bizarre behavior noted. 15 minutes safety checks maintained will continue to monitor.  "

## 2024-11-16 NOTE — Group Note (Signed)
 Date:  11/16/2024 Time:  5:20 AM  Group Topic/Focus:  Goals Group:   The focus of this group is to help patients establish daily goals to achieve during treatment and discuss how the patient can incorporate goal setting into their daily lives to aide in recovery.    Participation Level:  Active  Participation Quality:  Appropriate  Affect:  Appropriate  Cognitive:  Appropriate  Insight: Limited  Engagement in Group:  Engaged and Supportive  Modes of Intervention:  Discussion, Education, and Support  Additional Comments:    [name] L 11/16/2024, 5:20 AM

## 2024-11-16 NOTE — Progress Notes (Signed)
 [facility] MD Progress Note  11/16/2024 12:39 PM Denise Hall  MRN:  [id number]   Subjective:  Chart reviewed, case discussed in multidisciplinary meeting, patient seen during rounds.   12/28: On interview today, patient is found organizing belongings in her room.  She is alert and oriented, calm and cooperative.  She reports tolerating current medication regimen well without adverse effects.  She states she has noticed an improvement in mood with Trileptal .  She declines dose increase at this time.  Patient states she has been finding group therapies very helpful.  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  She denies SI/HI/plan and denies hallucinations.  She denies current symptoms of depression or anxiety.  She reports fair sleep and good appetite.  She is future oriented.  12/27: On interview today, patient is found resting in bed.  She is alert and oriented, calm and cooperative.  She reports tolerating current medication regimen, including initiation of Trileptal , well without adverse effects.  She reports stable sleep and appetite.  She denies SI/HI/plan and denies hallucinations.  She reports a recent episode of feeling overwhelmed, but was able to calm herself down by taking some time to herself in her room.  She is unable to identify what triggered these feelings.  She reports anxiety is improving overall.  She denies current depressive symptoms.  Past Psychiatric History: see h&P Family History: History reviewed. No pertinent family history. Social History:  Social History   Substance and Sexual Activity  Alcohol Use Never     Social History   Substance and Sexual Activity  Drug Use Not on file    Social History   Socioeconomic History   Marital status: Single    Spouse name: Not on file   Number of children: Not on file   Years of education: Not on file   Highest education level: Not on file  Occupational History   Not on file  Tobacco Use   Smoking status:  Never   Smokeless tobacco: Never  Substance and Sexual Activity   Alcohol use: Never   Drug use: Not on file   Sexual activity: Not on file  Other Topics Concern   Not on file  Social History Narrative   Not on file   Social Drivers of Health   Tobacco Use: Low Risk (11/14/2024)   Patient History    Smoking Tobacco Use: Never    Smokeless Tobacco Use: Never    Passive Exposure: Not on file  Financial Resource Strain: Not on file  Food Insecurity: No Food Insecurity (11/14/2024)   Epic    Worried About Programme Researcher, Broadcasting/film/video in the Last Year: Never true    Ran Out of Food in the Last Year: Never true  Transportation Needs: No Transportation Needs (11/14/2024)   Epic    Lack of Transportation (Medical): No    Lack of Transportation (Non-Medical): No  Physical Activity: Not on file  Stress: Not on file  Social Connections: Not on file  Depression (EYV7-0): Not on file  Alcohol Screen: Low Risk (11/14/2024)   Alcohol Screen    Last Alcohol Screening Score (AUDIT): 0  Housing: Low Risk (11/14/2024)   Epic    Unable to Pay for Housing in the Last Year: No    Number of Times Moved in the Last Year: 0    Homeless in the Last Year: No  Utilities: Not At Risk (11/14/2024)   Epic    Threatened with loss of utilities: No  Health Literacy: Not on file   Past Medical History: History reviewed. No pertinent past medical history. History reviewed. No pertinent surgical history.  Current Medications: Current Facility-Administered Medications  Medication Dose Route Frequency Provider Last Rate Last Admin   acetaminophen  (TYLENOL ) tablet 650 mg  650 mg Oral Q6H PRN Smith, Annie B, NP       alum & mag hydroxide-simeth (MAALOX/MYLANTA) 200-200-20 MG/5ML suspension 30 mL  30 mL Oral Q4H PRN Smith, Annie B, NP       haloperidol  (HALDOL ) tablet 5 mg  5 mg Oral TID PRN Smith, Annie B, NP       And   diphenhydrAMINE  (BENADRYL ) capsule 50 mg  50 mg Oral TID PRN Smith, Annie B, NP        haloperidol  lactate (HALDOL ) injection 5 mg  5 mg Intramuscular TID PRN Smith, Annie B, NP       And   diphenhydrAMINE  (BENADRYL ) injection 50 mg  50 mg Intramuscular TID PRN Smith, Annie B, NP       And   LORazepam  (ATIVAN ) injection 2 mg  2 mg Intramuscular TID PRN Smith, Annie B, NP       haloperidol  lactate (HALDOL ) injection 10 mg  10 mg Intramuscular TID PRN Smith, Annie B, NP       And   diphenhydrAMINE  (BENADRYL ) injection 50 mg  50 mg Intramuscular TID PRN Smith, Annie B, NP       And   LORazepam  (ATIVAN ) injection 2 mg  2 mg Intramuscular TID PRN Smith, Annie B, NP       hydrOXYzine  (ATARAX ) tablet 25 mg  25 mg Oral TID PRN Smith, Annie B, NP   25 mg at 11/13/24 2234   influenza vac split trivalent PF (FLUZONE ) injection 0.5 mL  0.5 mL Intramuscular Tomorrow-1000 Donnelly Mellow, MD       magnesium  hydroxide (MILK OF MAGNESIA) suspension 30 mL  30 mL Oral Daily PRN Smith, Annie B, NP       OXcarbazepine  (TRILEPTAL ) tablet 150 mg  150 mg Oral BID Tinnie Kunin L, PA-C   150 mg at 11/16/24 0813   pantoprazole  (PROTONIX ) EC tablet 40 mg  40 mg Oral Daily Brittan Butterbaugh L, PA-C   40 mg at 11/16/24 9266   traZODone  (DESYREL ) tablet 50 mg  50 mg Oral QHS PRN Smith, Annie B, NP   50 mg at 11/15/24 2128    Lab Results:  No results found for this or any previous visit (from the past 48 hours).   Blood Alcohol level:  Lab Results  Component Value Date   [facility] <15 11/13/2024    Metabolic Disorder Labs: No results found for: HGBA1C, MPG No results found for: PROLACTIN No results found for: CHOL, TRIG, HDL, CHOLHDL, VLDL, LDLCALC  Physical Findings: AIMS:  , ,  ,  ,    CIWA:    COWS:      Psychiatric Specialty Exam:  Presentation  General Appearance:  Appropriate for Environment  Eye Contact: Fair  Speech: Clear and Coherent  Speech Volume: Decreased    Mood and Affect  Mood: Euthymic  Affect: Depressed   Thought Process  Thought  Processes: Coherent; Linear  Orientation:Full (Time, Place and Person)  Thought Content:Logical  Hallucinations: Denies  Ideas of Reference:None  Suicidal Thoughts: Denies  Homicidal Thoughts: Denies   Sensorium  Memory: Immediate Good; Recent Good  Judgment: Poor  Insight: Fair   Executive Functions  Concentration: Good  Attention Span: Good  Recall: [place] of Knowledge: Fair  Language: Fair   Psychomotor Activity  Psychomotor Activity: Normal  Musculoskeletal: Strength & Muscle Tone: within normal limits Gait & Station: normal Assets  Assets: Manufacturing Systems Engineer; Desire for Improvement; Housing; Social Support    Physical Exam: Physical Exam ROS Blood pressure 111/77, pulse 77, temperature 98.5 F (36.9 C), temperature source Oral, resp. rate 17, height 5' 2 (1.575 m), weight 59.4 kg, SpO2 100%. Body mass index is 23.96 kg/m.  Diagnosis: Principal Problem:   Suicidal ideations   PLAN: Safety and Monitoring:  -- Voluntary admission to inpatient psychiatric unit for safety, stabilization and treatment  -- Daily contact with patient to assess and evaluate symptoms and progress in treatment  -- Patient's case to be discussed in multi-disciplinary team meeting  -- Observation Level : q15 minute checks  -- Vital signs:  q12 hours  -- Precautions: suicide, elopement, and assault -- Encouraged patient to participate in unit milieu and in scheduled group therapies  2. Psychiatric Treatment:  Scheduled Medications: Trileptal  150 mg twice daily    -- The risks/benefits/side-effects/alternatives to this medication were discussed in detail with the patient and time was given for questions. The patient consents to medication trial.  3. Medical Issues Being Addressed:  No acute concerns.    4. Discharge Planning:   -- Social work and case management to assist with discharge planning and identification of hospital follow-up needs prior to  discharge  -- Estimated LOS: 5-7 days Case discussed with attending physician, Dr. Hellen, who is in agreement with current plan. Camelia LITTIE Lukes, PA-C 11/16/2024, 12:39 PM

## 2024-11-16 NOTE — Plan of Care (Signed)
  Problem: Education: Goal: Knowledge of [facility] Education information/materials will improve Outcome: Progressing Goal: Emotional status will improve Outcome: Progressing Goal: Mental status will improve Outcome: Progressing   Problem: Activity: Goal: Interest or engagement in activities will improve Outcome: Progressing   Problem: Coping: Goal: Ability to verbalize frustrations and anger appropriately will improve Outcome: Progressing

## 2024-11-16 NOTE — Progress Notes (Signed)
" °   11/16/24 0900  Psych Admission Type (Psych Patients Only)  Admission Status Involuntary  Psychosocial Assessment  Patient Complaints None  Eye Contact Avertive  Facial Expression Flat  Affect Appropriate to circumstance  Speech Logical/coherent  Interaction Assertive;No initiation  Motor Activity Slow  Appearance/Hygiene Unremarkable  Behavior Characteristics Cooperative;Appropriate to situation  Mood Pleasant  Aggressive Behavior  Effect No apparent injury  Thought Process  Coherency WDL  Content WDL  Delusions None reported or observed  Perception WDL  Hallucination None reported or observed  Judgment Impaired  Confusion None  Danger to Self  Current suicidal ideation? Denies  Danger to Others  Danger to Others None reported or observed   Patient rated her depression and anxiety 0/10. Stated that she is feeling better than yesterday. Patient visible in the milieu. Support and encouragement given. "

## 2024-11-16 NOTE — Plan of Care (Signed)
   Problem: Education: Goal: Knowledge of [facility] Education information/materials will improve Outcome: Progressing   Problem: Education: Goal: Emotional status will improve Outcome: Progressing   Problem: Education: Goal: Mental status will improve Outcome: Progressing   Problem: Education: Goal: Verbalization of understanding the information provided will improve Outcome: Progressing

## 2024-11-17 DIAGNOSIS — R45851 Suicidal ideations: Secondary | ICD-10-CM | POA: Diagnosis not present

## 2024-11-17 MED ADMIN — Trazodone HCl Tab 50 MG: 50 mg | ORAL | NDC 00904686861

## 2024-11-17 MED ADMIN — Oxcarbazepine Tab 150 MG: 150 mg | ORAL | NDC 00904726261

## 2024-11-17 MED ADMIN — Pantoprazole Sodium EC Tab 40 MG (Base Equiv): 40 mg | ORAL | NDC 65862056099

## 2024-11-17 NOTE — Group Note (Signed)
 Date:  11/17/2024 Time:  1:06 PM  Group Topic/Focus:  Building Self Esteem:   The Focus of this group is helping patients become aware of the effects of self-esteem on their lives, the things they and others do that enhance or undermine their self-esteem, seeing the relationship between their level of self-esteem and the choices they make and learning ways to enhance self-esteem. Emotional Education:   The focus of this group is to discuss what feelings/emotions are, and how they are experienced.    Participation Level:  Active  Participation Quality:  Appropriate  Affect:  Appropriate  Cognitive:  Appropriate  Insight: Appropriate  Engagement in Group:  Engaged  Modes of Intervention:  Activity  Additional Comments:    Denise Hall DELENA June 11/17/2024, 1:06 PM

## 2024-11-17 NOTE — Group Note (Signed)
 Recreation Therapy Group Note   Group Topic:Problem Solving  Group Date: 11/17/2024 Start Time: 1010 End Time: 1050 Facilitators: Celestia Jeoffrey BRAVO, LRT, CTRS Location: Craft Room  Group Description: Life Boat. Patients were given the scenario that they are on a boat that is about to become shipwrecked, leaving them stranded on an island. They are asked to make a list of 15 different items that they want to take with them when they are stranded on the [place]. Patients are asked to rank their items from most important to least important, #1 being the most important and #15 being the least. Patients will work individually for the first round to come up with 15 items and then pair up with a peer(s) to condense their list and come up with one list of 15 items between the two of them. Patients or LRT will read aloud the 15 different items to the group after each round. LRT facilitated post-activity processing to discuss how this activity can be used in daily life post discharge.   Goal Area(s) Addressed:  Patient will identify priorities, wants and needs. Patient will communicate with LRT and peers. Patient will work collectively as a administrator, civil service. Patient will work on product manager.    Affect/Mood: Blunted and Flat   Participation Level: Moderate   Participation Quality: Independent   Behavior: Reserved   Speech/Thought Process: Coherent   Insight: Fair   Judgement: Fair    Modes of Intervention: Exploration, Guided Discussion, and Problem-solving   Patient Response to Interventions:  Receptive   Education Outcome:  In group clarification offered    Clinical Observations/Individualized Feedback: Denise Hall was mostly active in their participation of session activities and group discussion. Pt identified family, bible, money, clothes, and life jacket as some of the things she would bring with her.    Plan: Continue to engage patient in RT group sessions 2-3x/week.   Jeoffrey BRAVO Celestia, LRT, CTRS 11/17/2024 11:00 AM

## 2024-11-17 NOTE — Group Note (Signed)
 Date:  11/17/2024 Time:  8:33 PM  Group Topic/Focus:  Wrap-Up Group:   The focus of this group is to help patients review their daily goal of treatment and discuss progress on daily workbooks.    Participation Level:  Active  Participation Quality:  Appropriate, Attentive, and Sharing  Affect:  Appropriate  Cognitive:  Alert and Appropriate  Insight: Appropriate and Improving  Engagement in Group:  Developing/Improving, Engaged, and Supportive  Modes of Intervention:  Activity, Discussion, Rapport Building, and Support  Additional Comments:    Ginny JONETTA Galeazzi 11/17/2024, 8:33 PM

## 2024-11-17 NOTE — Plan of Care (Signed)
  Problem: Education: Goal: Emotional status will improve Outcome: Progressing Goal: Mental status will improve Outcome: Progressing   Problem: Activity: Goal: Interest or engagement in activities will improve Outcome: Progressing   Problem: Coping: Goal: Ability to verbalize frustrations and anger appropriately will improve Outcome: Progressing Goal: Ability to demonstrate self-control will improve Outcome: Progressing   Problem: Safety: Goal: Periods of time without injury will increase Outcome: Progressing

## 2024-11-17 NOTE — Progress Notes (Addendum)
" °   11/17/24 0900  Psych Admission Type (Psych Patients Only)  Admission Status Involuntary  Psychosocial Assessment  Patient Complaints None  Eye Contact Fair  Facial Expression Other (Comment) (WNL)  Affect Appropriate to circumstance  Speech Logical/coherent;Soft  Interaction Assertive  Motor Activity Slow  Appearance/Hygiene Unremarkable  Behavior Characteristics Cooperative;Appropriate to situation  Mood Pleasant  Aggressive Behavior  Effect No apparent injury  Thought Process  Coherency WDL  Content WDL  Delusions None reported or observed  Perception WDL  Hallucination None reported or observed  Judgment Impaired  Confusion None  Danger to Self  Current suicidal ideation? Denies  Danger to Others  Danger to Others None reported or observed   Patient rated her anxiety and depression 0/10 this morning. Later when patient heard from the SW that her discharge will be end of this week patient started crying. States  I am tired of this place. I got back pain from this bed. My food is always messed up. Told patient to let the staff know any issues with the dinner. Support and encouragement given. "

## 2024-11-17 NOTE — Progress Notes (Signed)
 Pt calm and pleasant during assessment denying SI/HI/AVH. Pt observed interacting appropriately with staff and peers on the unit. Pt compliant with medication administration per MD orders. Pt given education, support, and encouragement to be active in her treatment plan. Pt being monitored Q 15 minutes for safety per unit protocol, remains safe on the unit

## 2024-11-17 NOTE — Group Note (Signed)
 Date:  11/17/2024 Time:  3:49 AM  Group Topic/Focus:  Personal Choices and Values:   The focus of this group is to help patients assess and explore the importance of values in their lives, how their values affect their decisions, how they express their values and what opposes their expression. Wrap-Up Group:   The focus of this group is to help patients review their daily goal of treatment and discuss progress on daily workbooks.    Participation Level:  Active  Participation Quality:  Appropriate and Attentive  Affect:  Appropriate  Cognitive:  Alert, Appropriate, and Oriented  Insight: Appropriate and Good  Engagement in Group:  Engaged  Modes of Intervention:  Discussion and Support  Additional Comments:  N/A  Butler LITTIE Gelineau 11/17/2024, 3:49 AM

## 2024-11-17 NOTE — Group Note (Signed)
 Recreation Therapy Group Note   Group Topic:Coping Skills  Group Date: 11/17/2024 Start Time: 1530 End Time: 1605 Facilitators: Celestia Jeoffrey BRAVO, LRT, CTRS Location: Dayroom  Group Description: Meditation. LRT and patients discussed what they know about meditation and mindfulness. LRT played a Deep Breathing Meditation exercise script for patients to follow along to. LRT and patients discussed how meditation and deep breathing can be used as a coping skill post--discharge to help manage symptoms of stress.   Goal Area(s) Addressed: Patient will practice using relaxation technique. Patient will identify a new coping skill.  Patient will follow multistep directions to reduce anxiety and stress.   Affect/Mood: N/A   Participation Level: Did not attend    Clinical Observations/Individualized Feedback: Patient did not attend.  Plan: Continue to engage patient in RT group sessions 2-3x/week.   Jeoffrey BRAVO Celestia, LRT, CTRS 11/17/2024 5:06 PM

## 2024-11-17 NOTE — Group Note (Signed)
 [facility] LCSW Group Therapy Note   Group Date: 11/17/2024 Start Time: 1300 End Time: 1400   Type of Therapy/Topic:  Group Therapy:  Emotion Regulation  Participation Level:  Active   Mood:  Description of Group:    The purpose of this group is to assist patients in learning to regulate negative emotions and experience positive emotions. Patients will be guided to discuss ways in which they have been vulnerable to their negative emotions. These vulnerabilities will be juxtaposed with experiences of positive emotions or situations, and patients challenged to use positive emotions to combat negative ones. Special emphasis will be placed on coping with negative emotions in conflict situations, and patients will process healthy conflict resolution skills.  Therapeutic Goals: Patient will identify two positive emotions or experiences to reflect on in order to balance out negative emotions:  Patient will label two or more emotions that they find the most difficult to experience:  Patient will be able to demonstrate positive conflict resolution skills through discussion or role plays:   Summary of Patient Progress:   During group, patient and group explored the ways in which our thoughts can impact our feelings which impacts our behaviors. Group along with facilitator completed a thermometer activity where different areas of life were explored. Participants were asked to notate in which zone these areas exist in on their personal thermometers. The group then discussed coping skills, and safety plans to help better prepare for potential stressors and learn to better emotionally regulate.    ?     Therapeutic Modalities:   Cognitive Behavioral Therapy Feelings Identification Dialectical Behavioral Therapy   Denise CHRISTELLA Kerns, LCSW

## 2024-11-17 NOTE — Progress Notes (Signed)
" °   11/16/24 2000  Psych Admission Type (Psych Patients Only)  Admission Status Involuntary  Psychosocial Assessment  Patient Complaints None  Eye Contact Brief  Facial Expression Flat  Affect Flat  Speech Logical/coherent;Soft  Interaction Assertive;Poor;No initiation  Motor Activity Slow  Appearance/Hygiene Improved  Behavior Characteristics Cooperative;Appropriate to situation;Calm  Mood Pleasant  Aggressive Behavior  Effect No apparent injury  Thought Process  Coherency WDL  Content WDL  Delusions None reported or observed  Perception WDL  Hallucination None reported or observed  Judgment WDL  Confusion WDL  Danger to Self  Current suicidal ideation? Denies  Danger to Others  Danger to Others None reported or observed   Patient is calm and receptive to staff, thoughts are organized and coherent, affect is congruent with mood, 15 minutes safety checks maintained.  "

## 2024-11-17 NOTE — Progress Notes (Signed)
 [facility] MD Progress Note  11/17/2024 7:49 PM Denise Hall  MRN:  [id number]   Subjective:  Chart reviewed, case discussed in multidisciplinary meeting, patient seen during rounds.  December 29 patient reports conflicts with family members which resulted in feeling overwhelmed and having suicidal thoughts and stress related to work in school at this time denies any suicidal thoughts reports wanting outpatient therapy and family therapy possibility no side effects reported 12/28: On interview today, patient is found organizing belongings in her room.  She is alert and oriented, calm and cooperative.  She reports tolerating current medication regimen well without adverse effects.  She states she has noticed an improvement in mood with Trileptal .  She declines dose increase at this time.  Patient states she has been finding group therapies very helpful.  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  She denies SI/HI/plan and denies hallucinations.  She denies current symptoms of depression or anxiety.  She reports fair sleep and good appetite.  She is future oriented.  12/27: On interview today, patient is found resting in bed.  She is alert and oriented, calm and cooperative.  She reports tolerating current medication regimen, including initiation of Trileptal , well without adverse effects.  She reports stable sleep and appetite.  She denies SI/HI/plan and denies hallucinations.  She reports a recent episode of feeling overwhelmed, but was able to calm herself down by taking some time to herself in her room.  She is unable to identify what triggered these feelings.  She reports anxiety is improving overall.  She denies current depressive symptoms.  Past Psychiatric History: see h&P Family History: History reviewed. No pertinent family history. Social History:  Social History   Substance and Sexual Activity  Alcohol Use Never     Social History   Substance and Sexual Activity  Drug Use  Not on file    Social History   Socioeconomic History   Marital status: Single    Spouse name: Not on file   Number of children: Not on file   Years of education: Not on file   Highest education level: Not on file  Occupational History   Not on file  Tobacco Use   Smoking status: Never   Smokeless tobacco: Never  Substance and Sexual Activity   Alcohol use: Never   Drug use: Not on file   Sexual activity: Not on file  Other Topics Concern   Not on file  Social History Narrative   Not on file   Social Drivers of Health   Tobacco Use: Low Risk (11/14/2024)   Patient History    Smoking Tobacco Use: Never    Smokeless Tobacco Use: Never    Passive Exposure: Not on file  Financial Resource Strain: Not on file  Food Insecurity: No Food Insecurity (11/14/2024)   Epic    Worried About Programme Researcher, Broadcasting/film/video in the Last Year: Never true    Ran Out of Food in the Last Year: Never true  Transportation Needs: No Transportation Needs (11/14/2024)   Epic    Lack of Transportation (Medical): No    Lack of Transportation (Non-Medical): No  Physical Activity: Not on file  Stress: Not on file  Social Connections: Not on file  Depression (EYV7-0): Not on file  Alcohol Screen: Low Risk (11/14/2024)   Alcohol Screen    Last Alcohol Screening Score (AUDIT): 0  Housing: Low Risk (11/14/2024)   Epic    Unable to Pay for Housing in the Last  Year: No    Number of Times Moved in the Last Year: 0    Homeless in the Last Year: No  Utilities: Not At Risk (11/14/2024)   Epic    Threatened with loss of utilities: No  Health Literacy: Not on file   Past Medical History: History reviewed. No pertinent past medical history. History reviewed. No pertinent surgical history.  Current Medications: Current Facility-Administered Medications  Medication Dose Route Frequency Provider Last Rate Last Admin   acetaminophen  (TYLENOL ) tablet 650 mg  650 mg Oral Q6H PRN Smith, Annie B, NP   650 mg at  11/16/24 2117   alum & mag hydroxide-simeth (MAALOX/MYLANTA) 200-200-20 MG/5ML suspension 30 mL  30 mL Oral Q4H PRN Smith, Annie B, NP       haloperidol  (HALDOL ) tablet 5 mg  5 mg Oral TID PRN Smith, Annie B, NP       And   diphenhydrAMINE  (BENADRYL ) capsule 50 mg  50 mg Oral TID PRN Smith, Annie B, NP       haloperidol  lactate (HALDOL ) injection 5 mg  5 mg Intramuscular TID PRN Smith, Annie B, NP       And   diphenhydrAMINE  (BENADRYL ) injection 50 mg  50 mg Intramuscular TID PRN Smith, Annie B, NP       And   LORazepam  (ATIVAN ) injection 2 mg  2 mg Intramuscular TID PRN Smith, Annie B, NP       haloperidol  lactate (HALDOL ) injection 10 mg  10 mg Intramuscular TID PRN Smith, Annie B, NP       And   diphenhydrAMINE  (BENADRYL ) injection 50 mg  50 mg Intramuscular TID PRN Smith, Annie B, NP       And   LORazepam  (ATIVAN ) injection 2 mg  2 mg Intramuscular TID PRN Smith, Annie B, NP       hydrOXYzine  (ATARAX ) tablet 25 mg  25 mg Oral TID PRN Smith, Annie B, NP   25 mg at 11/13/24 2234   magnesium  hydroxide (MILK OF MAGNESIA) suspension 30 mL  30 mL Oral Daily PRN Smith, Annie B, NP       OXcarbazepine  (TRILEPTAL ) tablet 150 mg  150 mg Oral BID Hunter, Crystal L, PA-C   150 mg at 11/17/24 0745   pantoprazole  (PROTONIX ) EC tablet 40 mg  40 mg Oral Daily Hunter, Crystal L, PA-C   40 mg at 11/17/24 0745   traZODone  (DESYREL ) tablet 50 mg  50 mg Oral QHS PRN Smith, Annie B, NP   50 mg at 11/16/24 2117    Lab Results:  No results found for this or any previous visit (from the past 48 hours).   Blood Alcohol level:  Lab Results  Component Value Date   [facility] <15 11/13/2024    Metabolic Disorder Labs: No results found for: HGBA1C, MPG No results found for: PROLACTIN No results found for: CHOL, TRIG, HDL, CHOLHDL, VLDL, LDLCALC  Physical Findings: AIMS:  , ,  ,  ,    CIWA:    COWS:      Psychiatric Specialty Exam:  Presentation  General Appearance:  Appropriate for  Environment  Eye Contact: Fair  Speech: Clear and Coherent  Speech Volume: Decreased    Mood and Affect  Mood: Euthymic  Affect: Depressed   Thought Process  Thought Processes: Coherent; Linear  Orientation:Full (Time, Place and Person)  Thought Content:Logical  Hallucinations: Denies  Ideas of Reference:None  Suicidal Thoughts: Denies  Homicidal Thoughts: Denies   Sensorium  Memory:  Immediate Good; Recent Good  Judgment: Poor  Insight: Fair   Chartered Certified Accountant: Good  Attention Span: Good  Recall: Metta Abe of Knowledge: Fair  Language: Fair   Psychomotor Activity  Psychomotor Activity: Normal  Musculoskeletal: Strength & Muscle Tone: within normal limits Gait & Station: normal Assets  Assets: Manufacturing Systems Engineer; Desire for Improvement; Housing; Social Support    Physical Exam: Physical Exam ROS Blood pressure (!) 107/58, pulse 75, temperature 98.7 F (37.1 C), temperature source Oral, resp. rate 16, height 5' 2 (1.575 m), weight 59.4 kg, SpO2 100%. Body mass index is 23.96 kg/m.  Diagnosis: Principal Problem:   Suicidal ideations   PLAN: Safety and Monitoring:  -- Voluntary admission to inpatient psychiatric unit for safety, stabilization and treatment  -- Daily contact with patient to assess and evaluate symptoms and progress in treatment  -- Patient's case to be discussed in multi-disciplinary team meeting  -- Observation Level : q15 minute checks  -- Vital signs:  q12 hours  -- Precautions: suicide, elopement, and assault -- Encouraged patient to participate in unit milieu and in scheduled group therapies  2. Psychiatric Treatment:  Scheduled Medications: Trileptal  150 mg twice daily    -- The risks/benefits/side-effects/alternatives to this medication were discussed in detail with the patient and time was given for questions. The patient consents to medication trial.  3. Medical Issues  Being Addressed:  No acute concerns.    4. Discharge Planning:   -- Social work and case management to assist with discharge planning and identification of hospital follow-up needs prior to discharge  -- Estimated LOS: 5-7 days Case discussed with attending physician, Dr. Hellen, who is in agreement with current plan. Millie JONELLE Manners, MD 11/17/2024, 7:49 PM

## 2024-11-18 DIAGNOSIS — R45851 Suicidal ideations: Secondary | ICD-10-CM | POA: Diagnosis not present

## 2024-11-18 MED ADMIN — Trazodone HCl Tab 50 MG: 50 mg | ORAL | NDC 00904686861

## 2024-11-18 MED ADMIN — Oxcarbazepine Tab 150 MG: 150 mg | ORAL | NDC 00904726261

## 2024-11-18 MED ADMIN — Pantoprazole Sodium EC Tab 40 MG (Base Equiv): 40 mg | ORAL | NDC 65862056099

## 2024-11-18 MED ADMIN — Acetaminophen Tab 325 MG: 650 mg | ORAL | NDC 00904677361

## 2024-11-18 NOTE — Group Note (Signed)
 Recreation Therapy Group Note   Group Topic:Goal Setting  Group Date: 11/18/2024 Start Time: 1000 End Time: 1100 Facilitators: Celestia Jeoffrey BRAVO, LRT, CTRS Location: Craft Room  Group Description: Product/process Development Scientist. Patients were given many different magazines, a glue stick, markers, and a piece of cardstock paper. LRT and pts discussed the importance of having goals in life. LRT and pts discussed the difference between short-term and long-term goals, as well as what a SMART goal is. LRT encouraged pts to create a vision board, with images they picked and then cut out with safety scissors from the magazine, for themselves, that capture their short and long-term goals. LRT encouraged pts to show and explain their vision board to the group.   Goal Area(s) Addressed:  Patient will gain knowledge of short vs. long term goals.  Patient will identify goals for themselves. Patient will practice setting SMART goals. Patient will verbalize their goals to LRT and peers.   Affect/Mood: Appropriate and Flat   Participation Level: Active and Engaged   Participation Quality: Independent   Behavior: Cooperative   Speech/Thought Process: Coherent   Insight: Good   Judgement: Fair    Modes of Intervention: Art, Education, and Exploration   Patient Response to Interventions:  Receptive   Education Outcome:  Acknowledges education   Clinical Observations/Individualized Feedback: [name] was active in their participation of session activities and group discussion. Pt identified I want to travel and be a pediatric nurse as goals.    Plan: Continue to engage patient in RT group sessions 2-3x/week.   Jeoffrey BRAVO Celestia, LRT, CTRS 11/18/2024 11:08 AM

## 2024-11-18 NOTE — Group Note (Signed)
 [facility] LCSW Group Therapy Note   Group Date: 11/18/2024 Start Time: 1315 End Time: 1400  Type of Therapy/Topic:  Group Therapy:  Feelings about Diagnosis  Participation Level:  Active   Description of Group:    This group will allow patients to explore their thoughts and feelings about diagnoses they have received. Patients will be guided to explore their level of understanding and acceptance of these diagnoses. Facilitator will encourage patients to process their thoughts and feelings about the reactions of others to their diagnosis, and will guide patients in identifying ways to discuss their diagnosis with significant others in their lives. This group will be process-oriented, with patients participating in exploration of their own experiences as well as giving and receiving support and challenge from other group members.   Therapeutic Goals: 1. Patient will demonstrate understanding of diagnosis as evidence by identifying two or more symptoms of the disorder:  2. Patient will be able to express two feelings regarding the diagnosis 3. Patient will demonstrate ability to communicate their needs through discussion and/or role plays  Summary of Patient Progress: Patient was present in group.  Patient was attentive and engaged in group.  Pt shared the negative and positive of mental health diagnoses.  She also discussed how her support system has been supportive.   Therapeutic Modalities:   Cognitive Behavioral Therapy Brief Therapy Feelings Identification    Sherryle JINNY Margo, LCSW

## 2024-11-18 NOTE — Progress Notes (Signed)
 Patient presents: Patient presents appropriate and cooperative in milieu. Med compliant.    SI/HI/AVH: Denies   Plan: Denies   Groups attended: 3/3   Appetite: Adequate. Attended meals.   Sleep: No sleep disturbances reported.   PRNS: N/A   Disturbances: No disturbances. Patient remains cooperative in milieu.    Questions/concerns: No further questions or concerns. Patient states she is focusing on my goals so that I can get back to my life. She plans on doing this by attending group, being honest with everyone, and taking in the skills to cope.     VS: BP 118/69 (BP Location: Left Arm)   Pulse 74   Temp 98.9 F (37.2 C) (Oral)   Resp 16   Ht 5' 2 (1.575 m)   Wt 59.4 kg   LMP  (LMP Unknown) Comment: within the last month  SpO2 99%   BMI 23.96 kg/m

## 2024-11-18 NOTE — Progress Notes (Signed)
 [facility] MD Progress Note  11/18/2024 9:07 PM Lashaya Kienitz  MRN:  [id number]   Subjective:  Chart reviewed, case discussed in multidisciplinary meeting, patient seen during rounds.   12/30: On interview today, patient is noted to be pleasant, calm and cooperative, alert and oriented.  She is tolerating medication regimen well without adverse effects.  She reports good sleep and appetite.  She denies current symptoms of depression or anxiety.  She reports mood has been stable.  She is participating in group therapies and has found this helpful.  She denies SI/HI/plan and denies hallucinations.  She is willing to participate in outpatient follow-up.  She is future oriented.  Patient states mother has come to visit her every day and that she and mother have talked on the phone every day during hospitalization and interactions have been positive.  Patient states they are working on their relationship and communication. Safe discharge planning completed with mother, [name].  Mother confirms patient will be returning to live with her and her spouse upon hospital discharge.  Mother does not voice any safety concerns and is agreeable to patient's discharge tomorrow.  Mother states patient does not have access to firearms or other lethal means.  December 29 patient reports conflicts with family members which resulted in feeling overwhelmed and having suicidal thoughts and stress related to work in school at this time denies any suicidal thoughts reports wanting outpatient therapy and family therapy possibility no side effects reported  12/28: On interview today, patient is found organizing belongings in her room.  She is alert and oriented, calm and cooperative.  She reports tolerating current medication regimen well without adverse effects.  She states she has noticed an improvement in mood with Trileptal .  She declines dose increase at this time.  Patient states she has been finding group therapies very  helpful.  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  She denies SI/HI/plan and denies hallucinations.  She denies current symptoms of depression or anxiety.  She reports fair sleep and good appetite.  She is future oriented.  12/27: On interview today, patient is found resting in bed.  She is alert and oriented, calm and cooperative.  She reports tolerating current medication regimen, including initiation of Trileptal , well without adverse effects.  She reports stable sleep and appetite.  She denies SI/HI/plan and denies hallucinations.  She reports a recent episode of feeling overwhelmed, but was able to calm herself down by taking some time to herself in her room.  She is unable to identify what triggered these feelings.  She reports anxiety is improving overall.  She denies current depressive symptoms.  Past Psychiatric History: see h&P Family History: History reviewed. No pertinent family history. Social History:  Social History   Substance and Sexual Activity  Alcohol Use Never     Social History   Substance and Sexual Activity  Drug Use Not on file    Social History   Socioeconomic History   Marital status: Single    Spouse name: Not on file   Number of children: Not on file   Years of education: Not on file   Highest education level: Not on file  Occupational History   Not on file  Tobacco Use   Smoking status: Never   Smokeless tobacco: Never  Substance and Sexual Activity   Alcohol use: Never   Drug use: Not on file   Sexual activity: Not on file  Other Topics Concern   Not on file  Social History Narrative   Not on file   Social Drivers of Health   Tobacco Use: Low Risk (11/14/2024)   Patient History    Smoking Tobacco Use: Never    Smokeless Tobacco Use: Never    Passive Exposure: Not on file  Financial Resource Strain: Not on file  Food Insecurity: No Food Insecurity (11/14/2024)   Epic    Worried About Programme Researcher, Broadcasting/film/video in the Last Year:  Never true    Ran Out of Food in the Last Year: Never true  Transportation Needs: No Transportation Needs (11/14/2024)   Epic    Lack of Transportation (Medical): No    Lack of Transportation (Non-Medical): No  Physical Activity: Not on file  Stress: Not on file  Social Connections: Not on file  Depression (EYV7-0): Not on file  Alcohol Screen: Low Risk (11/14/2024)   Alcohol Screen    Last Alcohol Screening Score (AUDIT): 0  Housing: Low Risk (11/14/2024)   Epic    Unable to Pay for Housing in the Last Year: No    Number of Times Moved in the Last Year: 0    Homeless in the Last Year: No  Utilities: Not At Risk (11/14/2024)   Epic    Threatened with loss of utilities: No  Health Literacy: Not on file   Past Medical History: History reviewed. No pertinent past medical history. History reviewed. No pertinent surgical history.  Current Medications: Current Facility-Administered Medications  Medication Dose Route Frequency Provider Last Rate Last Admin   acetaminophen  (TYLENOL ) tablet 650 mg  650 mg Oral Q6H PRN Smith, Annie B, NP   650 mg at 11/16/24 2117   alum & mag hydroxide-simeth (MAALOX/MYLANTA) 200-200-20 MG/5ML suspension 30 mL  30 mL Oral Q4H PRN Smith, Annie B, NP       haloperidol  (HALDOL ) tablet 5 mg  5 mg Oral TID PRN Smith, Annie B, NP       And   diphenhydrAMINE  (BENADRYL ) capsule 50 mg  50 mg Oral TID PRN Smith, Annie B, NP       haloperidol  lactate (HALDOL ) injection 5 mg  5 mg Intramuscular TID PRN Smith, Annie B, NP       And   diphenhydrAMINE  (BENADRYL ) injection 50 mg  50 mg Intramuscular TID PRN Smith, Annie B, NP       And   LORazepam  (ATIVAN ) injection 2 mg  2 mg Intramuscular TID PRN Smith, Annie B, NP       haloperidol  lactate (HALDOL ) injection 10 mg  10 mg Intramuscular TID PRN Smith, Annie B, NP       And   diphenhydrAMINE  (BENADRYL ) injection 50 mg  50 mg Intramuscular TID PRN Smith, Annie B, NP       And   LORazepam  (ATIVAN ) injection 2 mg  2 mg  Intramuscular TID PRN Smith, Annie B, NP       hydrOXYzine  (ATARAX ) tablet 25 mg  25 mg Oral TID PRN Smith, Annie B, NP   25 mg at 11/13/24 2234   magnesium  hydroxide (MILK OF MAGNESIA) suspension 30 mL  30 mL Oral Daily PRN Smith, Annie B, NP       OXcarbazepine  (TRILEPTAL ) tablet 150 mg  150 mg Oral BID Dustyn Armbrister L, PA-C   150 mg at 11/18/24 9160   pantoprazole  (PROTONIX ) EC tablet 40 mg  40 mg Oral Daily Perris Conwell L, PA-C   40 mg at 11/18/24 9160   traZODone  (DESYREL ) tablet 50 mg  50 mg Oral QHS PRN Smith, Annie B, NP   50 mg at 11/17/24 2114    Lab Results:  No results found for this or any previous visit (from the past 48 hours).   Blood Alcohol level:  Lab Results  Component Value Date   [facility] <15 11/13/2024    Metabolic Disorder Labs: No results found for: HGBA1C, MPG No results found for: PROLACTIN No results found for: CHOL, TRIG, HDL, CHOLHDL, VLDL, LDLCALC  Physical Findings: AIMS:  , ,  ,  ,    CIWA:    COWS:      Psychiatric Specialty Exam:  Presentation  General Appearance:  Appropriate for Environment  Eye Contact: Fair  Speech: Clear and Coherent  Speech Volume: Decreased    Mood and Affect  Mood: Euthymic  Affect: Appropriate   Thought Process  Thought Processes: Coherent; Linear  Orientation:Full (Time, Place and Person)  Thought Content:Logical  Hallucinations: Denies  Ideas of Reference:None  Suicidal Thoughts: Denies  Homicidal Thoughts: Denies   Sensorium  Memory: Immediate Good; Recent Good  Judgment: Fair  Insight: Fair   Art Therapist  Concentration: Good  Attention Span: Good  Recall: Good  Fund of Knowledge: Fair  Language: Fair   Psychomotor Activity  Psychomotor Activity: Normal  Musculoskeletal: Strength & Muscle Tone: within normal limits Gait & Station: normal Assets  Assets: Manufacturing Systems Engineer; Desire for Improvement; Housing; Social  Support    Physical Exam: Physical Exam ROS Blood pressure 118/69, pulse 74, temperature 98.9 F (37.2 C), temperature source Oral, resp. rate 16, height 5' 2 (1.575 m), weight 59.4 kg, SpO2 99%. Body mass index is 23.96 kg/m.  Diagnosis: Principal Problem:   Suicidal ideations   PLAN: Safety and Monitoring:  -- Voluntary admission to inpatient psychiatric unit for safety, stabilization and treatment  -- Daily contact with patient to assess and evaluate symptoms and progress in treatment  -- Patient's case to be discussed in multi-disciplinary team meeting  -- Observation Level : q15 minute checks  -- Vital signs:  q12 hours  -- Precautions: suicide, elopement, and assault -- Encouraged patient to participate in unit milieu and in scheduled group therapies  2. Psychiatric Treatment:  Scheduled Medications: Trileptal  150 mg twice daily    -- The risks/benefits/side-effects/alternatives to this medication were discussed in detail with the patient and time was given for questions. The patient consents to medication trial.  3. Medical Issues Being Addressed:  No acute concerns.    4. Discharge Planning:   -- Social work and case management to assist with discharge planning and identification of hospital follow-up needs prior to discharge  -- Estimated LOS: 5-7 days Case discussed with attending physician, Dr. Hellen, who is in agreement with current plan. Camelia LITTIE Lukes, PA-C 11/18/2024, 9:07 PM

## 2024-11-18 NOTE — Plan of Care (Signed)

## 2024-11-18 NOTE — B[facility] Counselor (Signed)
 CSW spoke with pt's mother.  Mother reports no concerns for patient's discharge.   Sherryle Margo, MSW, LCSW 11/18/2024 3:56 PM

## 2024-11-18 NOTE — Plan of Care (Signed)
   Problem: Education: Goal: Emotional status will improve Outcome: Progressing Goal: Mental status will improve Outcome: Progressing

## 2024-11-18 NOTE — Group Note (Signed)
 Date:  11/18/2024 Time:  9:10 PM  Group Topic/Focus:  Healthy Communication:   The focus of this group is to discuss communication, barriers to communication, as well as healthy ways to communicate with others. Wrap-Up Group:   The focus of this group is to help patients review their daily goal of treatment and discuss progress on daily workbooks.    Participation Level:  Active  Participation Quality:  Appropriate and Attentive  Affect:  Appropriate  Cognitive:  Alert and Appropriate  Insight: Appropriate and Good  Engagement in Group:  Engaged  Modes of Intervention:  Discussion  Additional Comments:     Arlester CHRISTELLA Servant 11/18/2024, 9:10 PM

## 2024-11-18 NOTE — Group Note (Signed)
 Recreation Therapy Group Note   Group Topic:Coping Skills  Group Date: 11/18/2024 Start Time: 1530 End Time: 1605 Facilitators: Celestia Jeoffrey FORBES ARTICE, CTRS Location: Craft Room  Group Description: Coping A-Z. LRT and patients engage in a guided discussion on what coping skills are and gave specific examples. LRT passed out a handout labeled Coping A-Z with blank spaces beside each letter. LRT prompted patients to come up with a coping skill for each of the letters. LRT and patients went over the handout and gave ideas for each letter if anyone had any blanks left on their paper. Patients kept this handout with them that listed 26 different coping skills.   Goal Area(s) Addressed: Patients will be able to define coping skills. Patient will identify new coping skills.  Patient will increase communication.   Affect/Mood: Appropriate and Flat   Participation Level: Moderate    Clinical Observations/Individualized Feedback: Denise Hall was mostly active in their participation of session activities and group discussion. Pt did not interact with LRT or peers while present, however, did complete a handout.    Plan: Continue to engage patient in RT group sessions 2-3x/week.   [address] LRT, CTRS 11/18/2024 4:10 PM

## 2024-11-18 NOTE — Progress Notes (Signed)
 Pt calm and pleasant during assessment denying SI/HI/AVH. Pt observed interacting appropriately with staff and peers on the unit. Pt compliant with medication administration per MD orders. Pt given education, support, and encouragement to be active in her treatment plan. Pt being monitored Q 15 minutes for safety per unit protocol, remains safe on the unit

## 2024-11-19 DIAGNOSIS — R45851 Suicidal ideations: Secondary | ICD-10-CM | POA: Diagnosis not present

## 2024-11-19 MED ADMIN — Oxcarbazepine Tab 150 MG: 150 mg | ORAL | NDC 00904726261

## 2024-11-19 MED ADMIN — Pantoprazole Sodium EC Tab 40 MG (Base Equiv): 40 mg | ORAL | NDC 65862056099

## 2024-11-19 MED FILL — Oxcarbazepine Tab 150 MG: 150.0000 mg | ORAL | 30 days supply | Qty: 60 | Fill #0 | Status: AC

## 2024-11-19 NOTE — Progress Notes (Signed)
 Patient denies SI/I/AVH at this time. Discharge instructions, AVS, prescriptions, and transition record reviewed with patient. Patient agrees to comply with medication management, follow-up visit and outpatient therapy. Patient belongings returned to patient. Patient questions and concerns addressed and answered. Patient ambulatory off unit. Patient discharged to home and her mom provided transportation

## 2024-11-19 NOTE — Progress Notes (Signed)
" °  [facility] Adult Case Management Discharge Plan :  Will you be returning to the same living situation after discharge:  Yes,  pt reports that she is returning home.  At discharge, do you have transportation home?: Yes,  pt reports that her mother will provide transportation. Do you have the ability to pay for your medications: No.  Release of information consent forms completed and in the chart;  Patient's signature needed at discharge.  Patient to Follow up at:  Follow-up Information     [facility] Follow up.   Why: Appointment is scheduled for 11/26/24 at 9:30am.  Appointment is VIRTUAL. Contact information: [address]  [address] [place] [address] [phone]                 Next level of care provider has access to [facility] Link:no  Safety Planning and Suicide Prevention discussed: Yes,  SPE completed with patient and patient's mother.      Has patient been referred to the Quitline?: Patient does not use tobacco/nicotine products  Patient has been referred for addiction treatment: No known substance use disorder.  Sherryle JINNY Margo, LCSW 11/19/2024, 10:43 AM "

## 2024-11-19 NOTE — Plan of Care (Signed)
" °  Problem: Education: Goal: Knowledge of [facility] Education information/materials will improve 11/19/2024 1159 by Waddell Oddis BIRCH, RN Outcome: Adequate for Discharge 11/19/2024 1158 by Waddell Oddis BIRCH, RN Outcome: Adequate for Discharge Goal: Emotional status will improve 11/19/2024 1159 by Waddell Oddis BIRCH, RN Outcome: Adequate for Discharge 11/19/2024 1158 by Waddell Oddis BIRCH, RN Outcome: Adequate for Discharge Goal: Mental status will improve 11/19/2024 1159 by Waddell Oddis BIRCH, RN Outcome: Adequate for Discharge 11/19/2024 1158 by Waddell Oddis BIRCH, RN Outcome: Adequate for Discharge Goal: Verbalization of understanding the information provided will improve 11/19/2024 1159 by Waddell Oddis BIRCH, RN Outcome: Adequate for Discharge 11/19/2024 1158 by Waddell Oddis BIRCH, RN Outcome: Adequate for Discharge   Problem: Activity: Goal: Interest or engagement in activities will improve 11/19/2024 1159 by Waddell Oddis BIRCH, RN Outcome: Adequate for Discharge 11/19/2024 1158 by Waddell Oddis BIRCH, RN Outcome: Adequate for Discharge Goal: Sleeping patterns will improve 11/19/2024 1159 by Waddell Oddis BIRCH, RN Outcome: Adequate for Discharge 11/19/2024 1158 by Waddell Oddis BIRCH, RN Outcome: Adequate for Discharge   Problem: Coping: Goal: Ability to verbalize frustrations and anger appropriately will improve 11/19/2024 1159 by Waddell Oddis BIRCH, RN Outcome: Adequate for Discharge 11/19/2024 1158 by Waddell Oddis BIRCH, RN Outcome: Adequate for Discharge Goal: Ability to demonstrate self-control will improve 11/19/2024 1159 by Waddell Oddis BIRCH, RN Outcome: Adequate for Discharge 11/19/2024 1158 by Waddell Oddis BIRCH, RN Outcome: Adequate for Discharge   Problem: Health Behavior/Discharge Planning: Goal: Identification of resources available to assist in meeting health care needs will improve 11/19/2024 1159 by Waddell Oddis BIRCH, RN Outcome: Adequate for Discharge 11/19/2024 1158  by Waddell Oddis BIRCH, RN Outcome: Adequate for Discharge Goal: Compliance with treatment plan for underlying cause of condition will improve 11/19/2024 1159 by Waddell Oddis BIRCH, RN Outcome: Adequate for Discharge 11/19/2024 1158 by Waddell Oddis BIRCH, RN Outcome: Adequate for Discharge   Problem: Physical Regulation: Goal: Ability to maintain clinical measurements within normal limits will improve 11/19/2024 1159 by Waddell Oddis BIRCH, RN Outcome: Adequate for Discharge 11/19/2024 1158 by Waddell Oddis BIRCH, RN Outcome: Adequate for Discharge   Problem: Safety: Goal: Periods of time without injury will increase 11/19/2024 1159 by Waddell Oddis BIRCH, RN Outcome: Adequate for Discharge 11/19/2024 1158 by Waddell Oddis BIRCH, RN Outcome: Adequate for Discharge   "

## 2024-11-19 NOTE — Progress Notes (Signed)
" °   11/19/24 0802  Psych Admission Type (Psych Patients Only)  Admission Status Involuntary  Psychosocial Assessment  Patient Complaints None  Eye Contact Fair  Facial Expression Flat  Affect Appropriate to circumstance  Speech Logical/coherent  Interaction Assertive  Motor Activity Slow  Appearance/Hygiene Unremarkable  Behavior Characteristics Cooperative;Appropriate to situation  Mood Pleasant  Aggressive Behavior  Effect No apparent injury  Thought Process  Coherency WDL  Content WDL  Delusions None reported or observed  Perception WDL  Hallucination None reported or observed  Judgment WDL  Confusion WDL  Danger to Self  Current suicidal ideation? Denies  Danger to Others  Danger to Others None reported or observed    "

## 2024-11-19 NOTE — [facility] Suicide Risk Assessment (Signed)
 [facility] Discharge Suicide Risk Assessment   Principal Problem: Suicidal ideations Discharge Diagnoses: Principal Problem:   Suicidal ideations   Total Time spent with patient: 30 minutes  Musculoskeletal: Strength & Muscle Tone: within normal limits Gait & Station: normal Patient leans: N/A  Psychiatric Specialty Exam  Presentation  General Appearance:  Appropriate for Environment  Eye Contact: Fair  Speech: Clear and Coherent  Speech Volume: Normal  Handedness:No data recorded  Mood and Affect  Mood: Euthymic  Duration of Depression Symptoms: Greater than two weeks  Affect: Appropriate   Thought Process  Thought Processes: Coherent; Linear  Descriptions of Associations:Intact  Orientation:Full (Time, Place and Person)  Thought Content:Logical  History of Schizophrenia/Schizoaffective disorder:No  Duration of Psychotic Symptoms:No data recorded Hallucinations:Hallucinations: None  Ideas of Reference:None  Suicidal Thoughts:Suicidal Thoughts: No  Homicidal Thoughts:Homicidal Thoughts: No   Sensorium  Memory: Immediate Good; Recent Good  Judgment: Fair  Insight: Fair   Art Therapist  Concentration: Good  Attention Span: Good  Recall: Good  Fund of Knowledge: Fair  Language: Fair   Psychomotor Activity  Psychomotor Activity: Psychomotor Activity: Normal   Assets  Assets: Communication Skills; Desire for Improvement; Housing; Social Support; Vocational/Educational   Sleep  Sleep: Sleep: Good  Estimated Sleeping Duration (Last 24 Hours): 6.75-8.25 hours  Physical Exam: Physical Exam ROS Blood pressure 102/68, pulse 82, temperature 98.5 F (36.9 C), temperature source Oral, resp. rate 18, height 5' 2 (1.575 m), weight 59.4 kg, SpO2 100%. Body mass index is 23.96 kg/m.  Mental Status Per Nursing Assessment::   On Admission:  Suicidal ideation indicated by patient  Demographic Factors:  Adolescent or  young adult  Loss Factors: NA  Historical Factors: Impulsivity  Risk Reduction Factors:   Employed, Living with another person, especially a relative, Positive social support, and Positive coping skills or problem solving skills  Continued Clinical Symptoms:  Mood disorder, impulsivity   Cognitive Features That Contribute To Risk:  None    Suicide Risk:  Minimal: No identifiable suicidal ideation.  Patients presenting with no risk factors but with morbid ruminations; may be classified as minimal risk based on the severity of the depressive symptoms   Follow-up Information     [facility] Follow up.   Why: Appointment is scheduled for 11/26/24 at 9:30am.  Appointment is VIRTUAL. Contact information: [address]  [address] [place] [address] [phone]                 Plan Of Care/Follow-up recommendations:  Activity:  as tolerated   Mylynn Dinh L Emilija Bohman, PA-C 11/19/2024, 9:00 AM

## 2024-11-19 NOTE — Discharge Summary (Signed)
 " Physician Discharge Summary Note  Patient:  Denise Hall is an 18 y.o., female MRN:  [id number] DOB:  [birth year] Patient phone:  [phone] (home)  Patient address:   [address] [place] [address],   Total time spent: 40 min Date of Admission:  11/13/2024 Date of Discharge: 11/19/2024  Reason for Admission:  Suicidal ideation  Principal Problem: Suicidal ideations Discharge Diagnoses: Principal Problem:   Suicidal ideations   Past Psychiatric History: see h&p  Family Psychiatric  History: see h&p Social History:  Social History   Substance and Sexual Activity  Alcohol Use Never     Social History   Substance and Sexual Activity  Drug Use Not on file    Social History   Socioeconomic History   Marital status: Single    Spouse name: Not on file   Number of children: Not on file   Years of education: Not on file   Highest education level: Not on file  Occupational History   Not on file  Tobacco Use   Smoking status: Never   Smokeless tobacco: Never  Substance and Sexual Activity   Alcohol use: Never   Drug use: Not on file   Sexual activity: Not on file  Other Topics Concern   Not on file  Social History Narrative   Not on file   Social Drivers of Health   Tobacco Use: Low Risk (11/14/2024)   Patient History    Smoking Tobacco Use: Never    Smokeless Tobacco Use: Never    Passive Exposure: Not on file  Financial Resource Strain: Not on file  Food Insecurity: No Food Insecurity (11/14/2024)   Epic    Worried About Programme Researcher, Broadcasting/film/video in the Last Year: Never true    Ran Out of Food in the Last Year: Never true  Transportation Needs: No Transportation Needs (11/14/2024)   Epic    Lack of Transportation (Medical): No    Lack of Transportation (Non-Medical): No  Physical Activity: Not on file  Stress: Not on file  Social Connections: Not on file  Depression (EYV7-0): Not on file  Alcohol Screen: Low Risk (11/14/2024)   Alcohol Screen     Last Alcohol Screening Score (AUDIT): 0  Housing: Low Risk (11/14/2024)   Epic    Unable to Pay for Housing in the Last Year: No    Number of Times Moved in the Last Year: 0    Homeless in the Last Year: No  Utilities: Not At Risk (11/14/2024)   Epic    Threatened with loss of utilities: No  Health Literacy: Not on file   Past Medical History: History reviewed. No pertinent past medical history. History reviewed. No pertinent surgical history. Family History: History reviewed. No pertinent family history.  Hospital Course:  ***  On admission,  During the admission, the patient engaged in treatment, remained cooperative, and demonstrated consistent improvement in mood stability, coping skills, sleep, and appetite. She was compliant with medications and tolerated adjustments without adverse effects. She responded well to treatment with Trileptal . She consistently denied suicidal ideation, homicidal ideation, intent, or plan, and denied hallucinations throughout the hospitalization. She remained linear, logical, and future oriented, and was able to discuss coping strategies, support system, and crisis resources. She maintained safe behaviors on the unit. She denied access to lethal means. Safe discharge planning completed with mother, [name].  Mother confirms patient will be returning to live with her and her spouse upon hospital discharge.  Mother does not  voice any safety concerns and is agreeable to patient's discharge.  Mother states patient does not have access to firearms or other lethal means.   Detailed risk assessment is complete based on clinical exam and individual risk factors and acute suicide risk is low and acute violence risk is low.    On the day of discharge, patient denies SI/HI/plan and denies hallucinations.  Patient remains future oriented and is willing to participate in outpatient mental health services.  Currently, all modifiable risk of harm to self/harm to  others have been addressed and patient is no longer appropriate for the acute inpatient setting and is able to continue treatment for mental health needs in the community with the supports as indicated below.  Patient is educated and verbalized understanding of discharge plan of care including medications, follow-up appointments, mental health resources and further crisis services in the community.  She is instructed to call 911 or present to the nearest emergency room should she experience any decompensation in mood, disturbance of bowel or return of suicidal/homicidal ideations.  Patient verbalizes understanding of this education and agrees to this plan of care.  Physical Findings: AIMS:  , ,  ,  ,    CIWA:    COWS:      Psychiatric Specialty Exam:  Presentation  General Appearance:  Appropriate for Environment  Eye Contact: Fair  Speech: Clear and Coherent  Speech Volume: Normal    Mood and Affect  Mood: Euthymic  Affect: Appropriate   Thought Process  Thought Processes: Coherent; Linear  Descriptions of Associations:Intact  Orientation:Full (Time, Place and Person)  Thought Content:Logical  Hallucinations:Hallucinations: None  Ideas of Reference:None  Suicidal Thoughts:Suicidal Thoughts: No  Homicidal Thoughts:Homicidal Thoughts: No   Sensorium  Memory: Immediate Good; Recent Good  Judgment: Fair  Insight: Fair   Art Therapist  Concentration: Good  Attention Span: Good  Recall: Good  Fund of Knowledge: Fair  Language: Fair   Psychomotor Activity  Psychomotor Activity: Psychomotor Activity: Normal  Musculoskeletal: Strength & Muscle Tone: within normal limits Gait & Station: normal Assets  Assets: Manufacturing Systems Engineer; Desire for Improvement; Housing; Social Support; Vocational/Educational   Sleep  Sleep: Sleep: Good    Physical Exam: Physical Exam Constitutional:      Appearance: Normal appearance.  HENT:      Head: Normocephalic and atraumatic.     Nose: Nose normal.  Eyes:     Conjunctiva/sclera: Conjunctivae normal.  Pulmonary:     Effort: Pulmonary effort is normal.  Neurological:     Mental Status: She is alert and oriented to person, place, and time.  Psychiatric:        Attention and Perception: Attention and perception normal. She does not perceive auditory or visual hallucinations.        Mood and Affect: Mood and affect normal.        Speech: Speech normal.        Behavior: Behavior is cooperative.        Thought Content: Thought content is not paranoid or delusional. Thought content does not include homicidal or suicidal ideation. Thought content does not include homicidal or suicidal plan.        Cognition and Memory: Cognition normal.        Judgment: Judgment normal.    Review of Systems  Psychiatric/Behavioral:  Negative for depression, hallucinations, substance abuse and suicidal ideas. The patient is not nervous/anxious and does not have insomnia.    Blood pressure 102/68, pulse 82, temperature 98.5 F (36.9  C), temperature source Oral, resp. rate 18, height 5' 2 (1.575 m), weight 59.4 kg, SpO2 100%. Body mass index is 23.96 kg/m.   Tobacco Use History[1] Tobacco Cessation:  N/A, patient does not currently use tobacco products   Blood Alcohol level:  Lab Results  Component Value Date   [facility] <15 11/13/2024    Metabolic Disorder Labs:  No results found for: HGBA1C, MPG No results found for: PROLACTIN No results found for: CHOL, TRIG, HDL, CHOLHDL, VLDL, LDLCALC  See Psychiatric Specialty Exam and Suicide Risk Assessment completed by Attending Physician prior to discharge.  Discharge destination:  Home  Is patient on multiple antipsychotic therapies at discharge:  No   Has Patient had three or more failed trials of antipsychotic monotherapy by history:  No  Recommended Plan for Multiple Antipsychotic Therapies: NA  Discharge  Instructions     Increase activity slowly   Complete by: As directed       Allergies as of 11/19/2024   No Known Allergies      Medication List     TAKE these medications      Indication  OXcarbazepine  150 MG tablet Commonly known as: TRILEPTAL  Take 1 tablet (150 mg total) by mouth 2 (two) times daily.  Indication: Mood disorder   pantoprazole  40 MG tablet Commonly known as: PROTONIX  Take 40 mg by mouth daily.  Indication: Tracheoesophageal atresia        Follow-up Information     [facility] Follow up.   Why: Appointment is scheduled for 11/26/24 at 9:30am.  Appointment is VIRTUAL. Contact information: [address]  [address] [place] [address] [phone]                 Follow-up recommendations:  Activity:  as tolerated    Case discussed with supervising physician Dr. Ruther, who is in agreement with discharge plan.  Signed: Camelia LITTIE Lukes, PA-C 11/19/2024, 9:05 AM            [1]  Social History Tobacco Use  Smoking Status Never  Smokeless Tobacco Never   "

## 2024-11-19 NOTE — Plan of Care (Signed)
   Problem: Education: Goal: Emotional status will improve Outcome: Progressing Goal: Mental status will improve Outcome: Progressing

## 2024-11-19 NOTE — Group Note (Signed)
 Date:  11/19/2024 Time:  12:43 PM  Group Topic/Focus:  Personal Choices and Values:   The focus of this group is to help patients assess and explore the importance of values in their lives, how their values affect their decisions, how they express their values and what opposes their expression.    Participation Level:  Active  Participation Quality:  Appropriate  Affect:  Appropriate  Cognitive:  Appropriate  Insight: Appropriate  Engagement in Group:  Engaged  Modes of Intervention:  Activity       Denise Hall June 11/19/2024, 12:43 PM

## 2024-11-24 ENCOUNTER — Encounter (HOSPITAL_COMMUNITY): Payer: Self-pay | Admitting: Psychiatry
